# Patient Record
Sex: Female | Born: 1975 | Race: White | Hispanic: No | Marital: Married | Smoking: Former smoker
Health system: Southern US, Community
[De-identification: ages and names within clinical notes are randomized; demographics above are authoritative.]

## PROBLEM LIST (undated history)

## (undated) DIAGNOSIS — R51 Headache: Secondary | ICD-10-CM

## (undated) DIAGNOSIS — R519 Headache, unspecified: Secondary | ICD-10-CM

## (undated) HISTORY — PX: NO PAST SURGERIES: SHX2092

## (undated) HISTORY — DX: Headache: R51

## (undated) HISTORY — DX: Headache, unspecified: R51.9

---

## 2004-08-29 ENCOUNTER — Other Ambulatory Visit: Admission: RE | Admit: 2004-08-29 | Discharge: 2004-08-29 | Payer: Self-pay | Admitting: Obstetrics and Gynecology

## 2007-08-17 ENCOUNTER — Inpatient Hospital Stay (HOSPITAL_COMMUNITY): Admission: AD | Admit: 2007-08-17 | Discharge: 2007-08-17 | Payer: Self-pay | Admitting: Obstetrics and Gynecology

## 2007-09-12 ENCOUNTER — Inpatient Hospital Stay (HOSPITAL_COMMUNITY): Admission: AD | Admit: 2007-09-12 | Discharge: 2007-09-12 | Payer: Self-pay | Admitting: Obstetrics and Gynecology

## 2007-12-06 ENCOUNTER — Inpatient Hospital Stay (HOSPITAL_COMMUNITY): Admission: AD | Admit: 2007-12-06 | Discharge: 2007-12-08 | Payer: Self-pay | Admitting: Obstetrics and Gynecology

## 2007-12-16 ENCOUNTER — Ambulatory Visit: Admission: RE | Admit: 2007-12-16 | Discharge: 2007-12-16 | Payer: Self-pay | Admitting: Obstetrics and Gynecology

## 2009-02-12 ENCOUNTER — Encounter: Admission: RE | Admit: 2009-02-12 | Discharge: 2009-02-12 | Payer: Self-pay | Admitting: Obstetrics and Gynecology

## 2009-08-20 ENCOUNTER — Encounter: Admission: RE | Admit: 2009-08-20 | Discharge: 2009-08-20 | Payer: Self-pay | Admitting: Obstetrics and Gynecology

## 2009-10-25 ENCOUNTER — Inpatient Hospital Stay (HOSPITAL_COMMUNITY): Admission: AD | Admit: 2009-10-25 | Discharge: 2009-10-25 | Payer: Self-pay | Admitting: Obstetrics and Gynecology

## 2009-10-26 ENCOUNTER — Ambulatory Visit (HOSPITAL_COMMUNITY): Admission: AD | Admit: 2009-10-26 | Discharge: 2009-10-26 | Payer: Self-pay | Admitting: Obstetrics and Gynecology

## 2009-12-18 ENCOUNTER — Inpatient Hospital Stay (HOSPITAL_COMMUNITY): Admission: AD | Admit: 2009-12-18 | Discharge: 2009-12-19 | Payer: Self-pay | Admitting: Obstetrics and Gynecology

## 2009-12-21 ENCOUNTER — Ambulatory Visit: Admission: RE | Admit: 2009-12-21 | Discharge: 2009-12-21 | Payer: Self-pay | Admitting: Obstetrics and Gynecology

## 2010-02-27 ENCOUNTER — Encounter: Payer: Self-pay | Admitting: Obstetrics and Gynecology

## 2010-04-19 LAB — CBC
HCT: 29.4 % — ABNORMAL LOW (ref 36.0–46.0)
MCH: 33.6 pg (ref 26.0–34.0)
MCV: 95.8 fL (ref 78.0–100.0)
MCV: 97.1 fL (ref 78.0–100.0)
Platelets: 248 10*3/uL (ref 150–400)
Platelets: 295 10*3/uL (ref 150–400)
RBC: 3.03 MIL/uL — ABNORMAL LOW (ref 3.87–5.11)
RDW: 13.6 % (ref 11.5–15.5)
WBC: 11 10*3/uL — ABNORMAL HIGH (ref 4.0–10.5)

## 2010-11-08 LAB — CBC
HCT: 35.8 — ABNORMAL LOW
Hemoglobin: 10.8 — ABNORMAL LOW
MCHC: 34
Platelets: 210
Platelets: 291
RBC: 3.3 — ABNORMAL LOW
RBC: 3.74 — ABNORMAL LOW
RDW: 13.8
RDW: 14
WBC: 16.4 — ABNORMAL HIGH

## 2010-11-08 LAB — RPR: RPR Ser Ql: NONREACTIVE

## 2011-01-19 ENCOUNTER — Other Ambulatory Visit: Payer: Self-pay | Admitting: Family Medicine

## 2011-01-19 DIAGNOSIS — N649 Disorder of breast, unspecified: Secondary | ICD-10-CM

## 2011-02-03 ENCOUNTER — Ambulatory Visit
Admission: RE | Admit: 2011-02-03 | Discharge: 2011-02-03 | Disposition: A | Discharge status: Home / Self Care | Payer: 59 | Source: Ambulatory Visit | Attending: Family Medicine | Admitting: Family Medicine

## 2011-02-03 DIAGNOSIS — N649 Disorder of breast, unspecified: Secondary | ICD-10-CM

## 2014-01-21 ENCOUNTER — Encounter: Payer: Self-pay | Admitting: Neurology

## 2014-01-21 ENCOUNTER — Ambulatory Visit (INDEPENDENT_AMBULATORY_CARE_PROVIDER_SITE_OTHER): Payer: 59 | Admitting: Neurology

## 2014-01-21 VITALS — BP 132/68 | HR 100 | Temp 97.5°F | Resp 18 | Ht 65.0 in | Wt 133.7 lb

## 2014-01-21 DIAGNOSIS — M542 Cervicalgia: Secondary | ICD-10-CM

## 2014-01-21 DIAGNOSIS — G44211 Episodic tension-type headache, intractable: Secondary | ICD-10-CM

## 2014-01-21 MED ORDER — NORTRIPTYLINE HCL 50 MG PO CAPS: 50.0000 mg | ORAL_CAPSULE | Freq: Every day | ORAL | Status: DC

## 2014-01-21 MED ORDER — TIZANIDINE HCL 4 MG PO TABS: 4.0000 mg | ORAL_TABLET | Freq: Three times a day (TID) | ORAL | Status: DC | PRN

## 2014-01-21 NOTE — Progress Notes (Signed)
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  Records reviewed.  Onset:  In July 2014, developed daily headache for 4 months and then resolved.  From June to August 2015, had daily headache following chiropractic adjustment.  Started on nortriptyline and was doing well on 20mg  until she had another one month episode of daily headache.  Nortriptyline increased to 30mg  and she now only gets them for one week duration about once or twice a month. She had an XR of the cervical spine last year by her chiropractor, which was normal.  Location:  Starts with neck tightness and involves back and top of head. Quality:  Non-throbbing Intensity:  3-5/10 Aura:  no Prodrome:  no Associated symptoms:  No nausea, photophobia, phonophobia, visual disturbance, autonomic features Duration:  Now, 1 week.  Previously a couple of months. Frequency:  Once to twice a week. Triggers/exacerbating factors:  stress Relieving factors:  Heat Activity:  Able to function  Past abortive therapy:  tramadol, Fioricet, Extra strength Tylenol, ketorolac, Aleve Past preventative therapy:  none  Current abortive therapy:  cyclobenzaprine 10mg  for neck tension Current preventative therapy:  nortriptyline 30mg  Other medications:  Mirena  Caffeine:  1 cup coffee daily Alcohol:  occasionally Smoker:  no Diet:  good Exercise:  yes Depression/stress:  good Sleep hygiene:  good Family history of headache:  brother  PAST MEDICAL HISTORY: Past Medical History  Diagnosis Date  . Headache     PAST SURGICAL HISTORY: No past surgical history on file.  MEDICATIONS: No current  outpatient prescriptions on file prior to visit.   No current facility-administered medications on file prior to visit.    ALLERGIES: Not on File  FAMILY HISTORY: Family History  Problem Relation Age of Onset  . Cancer Mother   . Cirrhosis Father   . Cancer Paternal Grandmother     ovarian   . Heart failure Paternal Grandfather     SOCIAL HISTORY: History   Social History  . Marital Status: Married    Spouse Name: N/A    Number of Children: N/A  . Years of Education: N/A   Occupational History  . Not on file.   Social History Main Topics  . Smoking status: Former Research scientist (life sciences)  . Smokeless tobacco: Never Used  . Alcohol Use: 0.0 oz/week    0 Not specified per week     Comment: social   . Drug Use: No  . Sexual Activity:    Partners: Male   Other Topics Concern  . Not on file   Social History Narrative  . No narrative on file    REVIEW OF SYSTEMS: Constitutional: No fevers, chills, or sweats, no generalized fatigue, change in appetite Eyes: No visual changes, double vision, eye pain Ear, nose and throat: No hearing loss, ear pain, nasal congestion, sore throat Cardiovascular: No chest pain, palpitations Respiratory:  No shortness of breath at rest or with exertion, wheezes GastrointestinaI: No nausea, vomiting, diarrhea, abdominal pain, fecal incontinence Genitourinary:  No dysuria, urinary retention or frequency Musculoskeletal:  No neck pain, back pain Integumentary: No rash, pruritus, skin lesions Neurological: as above Psychiatric: No depression, insomnia, anxiety Endocrine: No palpitations, fatigue, diaphoresis, mood swings, change in appetite, change in  weight, increased thirst Hematologic/Lymphatic:  No anemia, purpura, petechiae. Allergic/Immunologic: no itchy/runny eyes, nasal congestion, recent allergic reactions, rashes  PHYSICAL EXAM: Filed Vitals:   01/21/14 0818  BP: 132/68  Pulse: 100  Temp: 97.5 F (36.4 C)  Resp: 18   General: No acute  distress Head:  Normocephalic/atraumatic Eyes:  fundi unremarkable, without vessel changes, exudates, hemorrhages or papilledema. Neck: supple, no paraspinal tenderness, full range of motion Back: No paraspinal tenderness Heart: regular rate and rhythm Lungs: Clear to auscultation bilaterally. Vascular: No carotid bruits. Neurological Exam: Mental status: alert and oriented to person, place, and time, recent and remote memory intact, fund of knowledge intact, attention and concentration intact, speech fluent and not dysarthric, language intact. Cranial nerves: CN I: not tested CN II: pupils equal, round and reactive to light, visual fields intact, fundi unremarkable, without vessel changes, exudates, hemorrhages or papilledema. CN III, IV, VI:  full range of motion, no nystagmus, no ptosis CN V: facial sensation intact CN VII: upper and lower face symmetric CN VIII: hearing intact CN IX, X: gag intact, uvula midline CN XI: sternocleidomastoid and trapezius muscles intact CN XII: tongue midline Bulk & Tone: normal, no fasciculations. Motor:  5/5 throughout Sensation:  Pinprick and vibration intact Deep Tendon Reflexes:  2+ throughout, toes downgoing Finger to nose testing:  No dysmetria Heel to shin:  No dysmetria Gait:  Normal station and stride.  Able to turn and walk in tandem. Romberg negative.  IMPRESSION: Episodic tension-type headaches, related to neck pain.  Normal, non-focal exam.  Will hold off on imaging of the brain at this point.  It really seems like tension and she is responding to medication at this point.  PLAN: 1.  Increase nortriptyline to 50mg  daily.  Call in 4 weeks with update 2.  Instead of cyclobenzaprine, will try tizanidine 4mg  to take at earliest onset of neck tension 3.  Follow up in 2 months.  Thank you for allowing me to take part in the care of this patient.  Metta Clines, DO  CC: Carlos Levering, PA-C

## 2014-01-21 NOTE — Patient Instructions (Signed)
1.  We will increase the nortriptyline.  I prescribed 50mg  capsule.  Take at bedtime.  Call in 4 weeks with update 2.  Instead of Flexeril, try tizanidine 4mg .  Take when you first feel the neck tension and hopefully will prevent onset of headache 3.  Follow up in 2 months.

## 2014-02-19 ENCOUNTER — Telehealth: Payer: Self-pay | Admitting: Neurology

## 2014-02-19 NOTE — Telephone Encounter (Signed)
Pt called and states that she has been on the medication for 4 weeks and it is working just wanted [place] to know if you need to talk to her you may call her at [phone]

## 2014-02-20 MED ORDER — NORTRIPTYLINE HCL 50 MG PO CAPS: 50.0000 mg | ORAL_CAPSULE | Freq: Every day | ORAL | Status: DC

## 2014-02-20 NOTE — Telephone Encounter (Signed)
Prescription sent to patient's pharmacy.

## 2014-02-20 NOTE — Telephone Encounter (Signed)
Pt called after the message form yesterday regarding her wanting the script for NORTRIPTYLINE 50mg  to be called in to her pharmacy since the samples worked out for her.  Pharmacy[facility]  C/b [phone]

## 2014-02-24 ENCOUNTER — Encounter: Payer: Self-pay | Admitting: Neurology

## 2014-03-06 ENCOUNTER — Other Ambulatory Visit: Payer: Self-pay | Admitting: Family Medicine

## 2014-03-06 DIAGNOSIS — N6459 Other signs and symptoms in breast: Secondary | ICD-10-CM

## 2014-03-17 ENCOUNTER — Ambulatory Visit
Admission: RE | Admit: 2014-03-17 | Discharge: 2014-03-17 | Disposition: A | Discharge status: Home / Self Care | Payer: 59 | Source: Ambulatory Visit | Attending: Family Medicine | Admitting: Family Medicine

## 2014-03-17 DIAGNOSIS — N6459 Other signs and symptoms in breast: Secondary | ICD-10-CM

## 2014-03-27 ENCOUNTER — Ambulatory Visit (INDEPENDENT_AMBULATORY_CARE_PROVIDER_SITE_OTHER): Payer: 59 | Admitting: Neurology

## 2014-03-27 ENCOUNTER — Encounter: Payer: Self-pay | Admitting: Neurology

## 2014-03-27 VITALS — BP 132/78 | HR 112 | Ht 63.0 in | Wt 136.0 lb

## 2014-03-27 DIAGNOSIS — R51 Headache: Secondary | ICD-10-CM

## 2014-03-27 DIAGNOSIS — G4486 Cervicogenic headache: Secondary | ICD-10-CM

## 2014-03-27 DIAGNOSIS — G44219 Episodic tension-type headache, not intractable: Secondary | ICD-10-CM

## 2014-03-27 DIAGNOSIS — M542 Cervicalgia: Secondary | ICD-10-CM

## 2014-03-27 NOTE — Progress Notes (Signed)
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  UPDATE: Current abortive therapy:  tizanidine 4mg  Current preventative therapy:  nortriptyline 50mg   Headaches have almost completely resolved.  However, the neck pain is still the problem.  Tizanidine helps if she takes it early enough.  Some days, she is pain-free.  On occasion, it can cause pain traveling up the back of the head.  HISTORY: Onset:  In July 2014, developed daily headache for 4 months and then resolved.  From June to August 2015, had daily headache following chiropractic adjustment.  Started on nortriptyline and was doing well on 20mg  until she had another one month episode of daily headache.  Nortriptyline increased to 30mg  and she now only gets them for one week duration about once or twice a month. She had an XR of the cervical spine last year by her chiropractor, which was normal.  Location:  Starts with neck tightness and involves back and top of head. Quality:  Non-throbbing Initial Intensity:  3-5/10 Aura:  no Prodrome:  no Associated symptoms:  No nausea, photophobia, phonophobia, visual disturbance, autonomic features Initial Duration:  Now, 1 week.  Previously a couple of months. Initial Frequency:  Once to twice a week. Triggers/exacerbating factors:  stress Relieving factors:  Heat Activity:  Able to function  Past abortive therapy:  tramadol, Fioricet, Extra strength Tylenol, ketorolac, Aleve, cyclobenzaprine Past preventative therapy:  none  PAST MEDICAL HISTORY: Past Medical History  Diagnosis Date  . Headache     MEDICATIONS: Current Outpatient Prescriptions on File Prior to Visit  Medication Sig Dispense Refill  . nortriptyline (PAMELOR) 50 MG capsule Take 1 capsule (50 mg  total) by mouth at bedtime. 30 capsule 3  . tiZANidine (ZANAFLEX) 4 MG tablet Take 1 tablet (4 mg total) by mouth every 8 (eight) hours as needed for muscle spasms. 30 tablet 0   No current facility-administered medications on file prior to visit.    ALLERGIES: No Known Allergies  FAMILY HISTORY: Family History  Problem Relation Age of Onset  . Cancer Mother   . Cirrhosis Father   . Cancer Paternal Grandmother     ovarian   . Heart failure Paternal Grandfather     SOCIAL HISTORY: History   Social History  . Marital Status: Married    Spouse Name: N/A  . Number of Children: N/A  . Years of Education: N/A   Occupational History  . Not on file.   Social History Main Topics  . Smoking status: Former Research scientist (life sciences)  . Smokeless tobacco: Never Used  . Alcohol Use: 0.0 oz/week    0 Standard drinks or equivalent per week     Comment: social   . Drug Use: No  . Sexual Activity:    Partners: Male   Other Topics Concern  . Not on file   Social History Narrative    REVIEW OF SYSTEMS: Constitutional: No fevers, chills, or sweats, no generalized fatigue, change in appetite Eyes: No visual changes, double vision, eye pain Ear, nose and throat: No hearing loss, ear pain, nasal congestion, sore throat Cardiovascular: No chest pain, palpitations Respiratory:  No shortness of breath at rest or with exertion, wheezes GastrointestinaI: No nausea, vomiting, diarrhea, abdominal pain, fecal incontinence Genitourinary:  No dysuria, urinary retention or frequency Musculoskeletal:  Neck Pain Integumentary: No rash, pruritus, skin lesions Neurological: as above Psychiatric: No depression, insomnia, anxiety Endocrine: No palpitations, fatigue, diaphoresis, mood swings, change in appetite, change in weight, increased thirst Hematologic/Lymphatic:  No anemia, purpura, petechiae. Allergic/Immunologic: no itchy/runny eyes, nasal congestion, recent allergic reactions, rashes  PHYSICAL  EXAM: Filed Vitals:   03/27/14 0853  BP: 132/78  Pulse: 112   General: No acute distress Head:  Normocephalic/atraumatic Eyes:  Fundoscopic exam unremarkable without vessel changes, exudates, hemorrhages or papilledema. Neck: supple, no paraspinal tenderness, full range of motion Heart:  Regular rate and rhythm Lungs:  Clear to auscultation bilaterally Back: No paraspinal tenderness Neurological Exam: alert and oriented to person, place, and time. Attention span and concentration intact, recent and remote memory intact, fund of knowledge intact.  Speech fluent and not dysarthric, language intact.  CN II-XII intact. Fundoscopic exam unremarkable without vessel changes, exudates, hemorrhages or papilledema.  Bulk and tone normal, muscle strength 5/5 throughout.  Deep tendon reflexes 2+ throughout, toes downgoing.  Finger to nose testing intact.  Gait normal  IMPRESSION: Cervicogenic/tension type headaches, improved Neck pain  PLAN: 1.  Will refer to Dr. Tamala [facility] of [facility] for evaluation and treatment of neck pain 2.  Continue nortriptyline 50mg  at bedtime 3.  Tizanidine 4mg  as needed 4.  Follow up in 3 months.  Metta Clines, DO  CC: Carlos Levering, PA-C

## 2014-03-27 NOTE — Patient Instructions (Signed)
1.  Continue nortriptyline 50mg  at bedtime 2.  Tizanidine 4mg  as needed for neck pain 3.  Will refer to [facility] for evaluation and treatment of neck pain. 4.  Follow up in 3 months.

## 2014-03-30 ENCOUNTER — Telehealth: Payer: Self-pay | Admitting: Family Medicine

## 2014-03-30 NOTE — Telephone Encounter (Signed)
Patient is a new patient.  She is requesting an appointment for the 26th.

## 2014-03-30 NOTE — Telephone Encounter (Signed)
Spoke to pt, she was wanting to be put on the waiting list for Friday if anything opens up.

## 2014-04-03 ENCOUNTER — Telehealth: Payer: Self-pay | Admitting: Neurology

## 2014-04-03 NOTE — Telephone Encounter (Signed)
Note faxed to Dr Eloise Levels at [phone] with confirmation received.

## 2014-04-10 ENCOUNTER — Ambulatory Visit (INDEPENDENT_AMBULATORY_CARE_PROVIDER_SITE_OTHER): Payer: 59 | Admitting: Family Medicine

## 2014-04-10 ENCOUNTER — Encounter: Payer: Self-pay | Admitting: Family Medicine

## 2014-04-10 VITALS — BP 142/84 | HR 102 | Ht 63.0 in | Wt 137.0 lb

## 2014-04-10 DIAGNOSIS — M9902 Segmental and somatic dysfunction of thoracic region: Secondary | ICD-10-CM

## 2014-04-10 DIAGNOSIS — M999 Biomechanical lesion, unspecified: Secondary | ICD-10-CM | POA: Insufficient documentation

## 2014-04-10 DIAGNOSIS — R51 Headache: Secondary | ICD-10-CM

## 2014-04-10 DIAGNOSIS — G4486 Cervicogenic headache: Secondary | ICD-10-CM | POA: Insufficient documentation

## 2014-04-10 DIAGNOSIS — M9901 Segmental and somatic dysfunction of cervical region: Secondary | ICD-10-CM

## 2014-04-10 DIAGNOSIS — M9908 Segmental and somatic dysfunction of rib cage: Secondary | ICD-10-CM

## 2014-04-10 NOTE — Assessment & Plan Note (Signed)
Discussed with patient at great length. I do believe that patient's stress level as well as musculoskeletal complaints is causing her headaches. We discussed postural changes at work as well as over-the-counter natural supplementations and can decrease inflammation. We discussed diet changes. Patient did respond very well to osteopathic manipulation today as well. Patient is going to try to make these daily changes and come back and see me again in 2-3 weeks for further evaluation and management.  Very optimistic patient will do significantly well with conservative therapy.

## 2014-04-10 NOTE — Patient Instructions (Signed)
Good to see you.   Exercises 3 times a week.  On wall heels, butt, shoulder and head touching for goal of 5 minutes daily Keep monitor at eye level.  2 tennis balls in tube sock and lay on it.  Watch sleeping positoin Vitamin D 2000 IU daily Turmeric 500mg  twice daily Fish oil 2 grams daily Ibuprofen 600mg  3 times a day for 3-5 day or aleve 2 pills 2 times a day for 3 days See me again in 2-3 weeks.

## 2014-04-10 NOTE — Progress Notes (Signed)
[name] [place], [place] [address] Phone: [phone] Subjective:    I'm seeing this patient by the request  of:  WILLARD,JENNIFER, PA-C   CC: Headaches and neck pain.   [id number] Caitlyn [name] is a 39 y.o. female coming in with complaint of headaches and neck pain. Patient has been followed by neurology previously. Patient has been taken some medications and does have some benefit from the muscle relaxer. Patient did have some chiropractic adjustments and unfortunately it seemed to make her headaches worse. Patient continued to have headaches even one time a week after nortriptyline was added. Patient has had x-rays of her neck which were unremarkable. These were reviewed by me today. States that it only seems to be more of a neck tightness that occurs first and then radiates towards the top of her head. Patient describes it as more of a dull ache than a true throbbing. Patient states the severity of pain can be 5 out of 10. Denies any photophobia, phonophobia, visual disturbance, or any numbness or weakness in any of the extremities. Patient also denies any fevers or chills or any weight loss. Patient has found that different triggers especially stressed seems to be associated with them. Patient states that the frequency is 1-2 times a week. Still able to do all daily activities and denies any nighttime awakening.     Past medical history, social, surgical and family history all reviewed in electronic medical record.   Review of Systems: No headache, visual changes, nausea, vomiting, diarrhea, constipation, dizziness, abdominal pain, skin rash, fevers, chills, night sweats, weight loss, swollen lymph nodes, body aches, joint swelling, muscle aches, chest pain, shortness of breath, mood changes.   Objective Blood pressure 142/84, pulse 102, height 5\' 3"  (1.6 m), weight 137 lb (62.143 kg), SpO2 98 %.  General: No apparent distress alert and  oriented x3 mood and affect normal, dressed appropriately.  HEENT: Pupils equal, extraocular movements intact  Respiratory: Patient's speak in full sentences and does not appear short of breath  Cardiovascular: No lower extremity edema, non tender, no erythema  Skin: Warm dry intact with no signs of infection or rash on extremities or on axial skeleton.  Abdomen: Soft nontender  Neuro: Cranial nerves II through XII are intact, neurovascularly intact in all extremities with 2+ DTRs and 2+ pulses.  Lymph: No lymphadenopathy of posterior or anterior cervical chain or axillae bilaterally.  Gait normal with good balance and coordination.  MSK:  Non tender with full range of motion and good stability and symmetric strength and tone of shoulders, elbows, wrist, hip, knee and ankles bilaterally.  Neck: Inspection unremarkable. No palpable stepoffs. Negative Spurling's maneuver. Mild limitation with right-sided rotation and left-sided side bending. Grip strength and sensation normal in bilateral hands Strength good C4 to T1 distribution No sensory change to C4 to T1 Negative Hoffman sign bilaterally Reflexes normal  OMT Physical Exam  Standing structural       Occiput right on left  Shoulder right higher  Inferior angle of scapula right higher    Standing flexion left  Seated Flexion left  Cervical  C2 flexed rotated and side bent right C4 flexed rotated and side bent left C6 flexed rotated and side bent right  Thoracic T2 extended rotated and side bent right with inhaled second rib  Lumbar L2 flexed rotated and side bent left  Sacrum Left on left  Illium Left anterior ilium      Impression  and Recommendations:     This case required medical decision making of moderate complexity.

## 2014-04-10 NOTE — Progress Notes (Signed)
Pre visit review using our clinic review tool, if applicable. No additional management support is needed unless otherwise documented below in the visit note. 

## 2014-04-10 NOTE — Assessment & Plan Note (Signed)
Decision today to treat with OMT was based on Physical Exam  After verbal consent patient was treated with HVLA, ME techniques in cervical, thoracic, lumbar and sacral as well as rib areas  Patient tolerated the procedure well with improvement in symptoms  Patient given exercises, stretches and lifestyle modifications  See medications in patient instructions if given  Patient will follow up in 2-3 weeks

## 2014-04-24 ENCOUNTER — Ambulatory Visit (INDEPENDENT_AMBULATORY_CARE_PROVIDER_SITE_OTHER): Payer: 59 | Admitting: Family Medicine

## 2014-04-24 ENCOUNTER — Encounter: Payer: Self-pay | Admitting: Family Medicine

## 2014-04-24 VITALS — BP 130/84 | HR 122 | Ht 63.0 in | Wt 136.0 lb

## 2014-04-24 DIAGNOSIS — M9908 Segmental and somatic dysfunction of rib cage: Secondary | ICD-10-CM

## 2014-04-24 DIAGNOSIS — M9902 Segmental and somatic dysfunction of thoracic region: Secondary | ICD-10-CM

## 2014-04-24 DIAGNOSIS — M9901 Segmental and somatic dysfunction of cervical region: Secondary | ICD-10-CM

## 2014-04-24 DIAGNOSIS — G4486 Cervicogenic headache: Secondary | ICD-10-CM

## 2014-04-24 DIAGNOSIS — R51 Headache: Secondary | ICD-10-CM

## 2014-04-24 DIAGNOSIS — M999 Biomechanical lesion, unspecified: Secondary | ICD-10-CM

## 2014-04-24 NOTE — Progress Notes (Signed)
Pre visit review using our clinic review tool, if applicable. No additional management support is needed unless otherwise documented below in the visit note. 

## 2014-04-24 NOTE — Patient Instructions (Signed)
You are doing great Keep it up and the posture is doing great! Continue the vitamins Continue to work on sleeping position My job is easy See me again in 3-4 weeks.

## 2014-04-24 NOTE — Assessment & Plan Note (Signed)
Decision today to treat with OMT was based on Physical Exam  After verbal consent patient was treated with HVLA, ME techniques in cervical, thoracic, lumbar and sacral as well as rib areas  Patient tolerated the procedure well with improvement in symptoms  Patient given exercises, stretches and lifestyle modifications  See medications in patient instructions if given  Patient will follow up in 3-4 weeks

## 2014-04-24 NOTE — Assessment & Plan Note (Signed)
She is doing significantly better with decreased amount headaches. Patient will keep with the postural exercises and keep working on retraction of the shoulder blades. We discussed icing regimen and continuing the natural supplementations. Patient will be spaced out and we will go to 4 week intervals.

## 2014-04-24 NOTE — Progress Notes (Signed)
  [name] [place], [place] [address] Phone: [phone] Subjective:     CC: Headaches and neck pain follow-up  [id number] Caitlyn [name] is a 39 y.o. female coming in with complaint of headaches and neck pain. Patient has been followed by neurology previously. Patient did have osteopathic manipulation and we did do different diet changes. Patient states that she is approximately 80% better. Really having minimal headaches at this time. Continues the medications on an as-needed basis. Patient is taking vitamins without any side effects. Patient is very happy with the results.      Past medical history, social, surgical and family history all reviewed in electronic medical record.   Review of Systems: No headache, visual changes, nausea, vomiting, diarrhea, constipation, dizziness, abdominal pain, skin rash, fevers, chills, night sweats, weight loss, swollen lymph nodes, body aches, joint swelling, muscle aches, chest pain, shortness of breath, mood changes.   Objective Blood pressure 130/84, pulse 122, height 5\' 3"  (1.6 m), weight 136 lb (61.689 kg), SpO2 99 %.  General: No apparent distress alert and oriented x3 mood and affect normal, dressed appropriately.  HEENT: Pupils equal, extraocular movements intact  Respiratory: Patient's speak in full sentences and does not appear short of breath  Cardiovascular: No lower extremity edema, non tender, no erythema  Skin: Warm dry intact with no signs of infection or rash on extremities or on axial skeleton.  Abdomen: Soft nontender  Neuro: Cranial nerves II through XII are intact, neurovascularly intact in all extremities with 2+ DTRs and 2+ pulses.  Lymph: No lymphadenopathy of posterior or anterior cervical chain or axillae bilaterally.  Gait normal with good balance and coordination.  MSK:  Non tender with full range of motion and good stability and symmetric strength and tone of shoulders,  elbows, wrist, hip, knee and ankles bilaterally.  Neck: Inspection unremarkable. No palpable stepoffs. Negative Spurling's maneuver. Mild limitation with right-sided rotation and left-sided side bending. Grip strength and sensation normal in bilateral hands Strength good C4 to T1 distribution No sensory change to C4 to T1 Negative Hoffman sign bilaterally Reflexes normal  OMT Physical Exam  Standing structural       Occiput right on left  Shoulder right higher    Cervical  C2 flexed rotated and side bent right C4 flexed rotated and side bent left C6 flexed rotated and side bent right  Thoracic T2 extended rotated and side bent right with inhaled second rib  Lumbar L2 flexed rotated and side bent left  Sacrum Left on left  Illium Left anterior ilium      Impression and Recommendations:     This case required medical decision making of moderate complexity.

## 2014-05-13 ENCOUNTER — Ambulatory Visit: Payer: 59 | Admitting: Family Medicine

## 2014-05-19 ENCOUNTER — Ambulatory Visit (INDEPENDENT_AMBULATORY_CARE_PROVIDER_SITE_OTHER): Payer: 59 | Admitting: Family Medicine

## 2014-05-19 ENCOUNTER — Encounter: Payer: Self-pay | Admitting: Family Medicine

## 2014-05-19 VITALS — BP 122/82 | HR 124 | Ht 63.0 in | Wt 136.0 lb

## 2014-05-19 DIAGNOSIS — M9902 Segmental and somatic dysfunction of thoracic region: Secondary | ICD-10-CM | POA: Diagnosis not present

## 2014-05-19 DIAGNOSIS — M999 Biomechanical lesion, unspecified: Secondary | ICD-10-CM

## 2014-05-19 DIAGNOSIS — R51 Headache: Secondary | ICD-10-CM

## 2014-05-19 DIAGNOSIS — M9908 Segmental and somatic dysfunction of rib cage: Secondary | ICD-10-CM | POA: Diagnosis not present

## 2014-05-19 DIAGNOSIS — M9901 Segmental and somatic dysfunction of cervical region: Secondary | ICD-10-CM | POA: Diagnosis not present

## 2014-05-19 DIAGNOSIS — G4486 Cervicogenic headache: Secondary | ICD-10-CM

## 2014-05-19 NOTE — Patient Instructions (Signed)
Good to see you Caitlyn [name] is your friend Keep using the tennis balls Use the muscle relaxer Consider decreasing the caffeine and starting DHEA 50 mg daily See me again in 4 weeks

## 2014-05-19 NOTE — Progress Notes (Signed)
  [name] [place] [place], [place] [address] Phone: [phone] Subjective:     CC: Headaches and neck pain follow-up  [id number] Caitlyn [name] is a 39 y.o. female coming in with complaint of headaches and neck pain. Patient has been followed by neurology previously. Patient did have osteopathic manipulation and we did do different diet changes. Patient was doing significantly better but is continuing to have some pain. Patient has needed a muscle relaxer fairly regularly. Patient notices most of time the headache seems to be more at night after for 5 PM after patient is done with work. Patient denies any radiation into her hands. Patient feels still that she is much better than her previous baseline but not as much improvement as she did previously.      Past medical history, social, surgical and family history all reviewed in electronic medical record.   Review of Systems: No headache, visual changes, nausea, vomiting, diarrhea, constipation, dizziness, abdominal pain, skin rash, fevers, chills, night sweats, weight loss, swollen lymph nodes, body aches, joint swelling, muscle aches, chest pain, shortness of breath, mood changes.   Objective Blood pressure 122/82, pulse 124, height 5\' 3"  (1.6 m), weight 136 lb (61.689 kg), SpO2 99 %.  General: No apparent distress alert and oriented x3 mood and affect normal, dressed appropriately.  HEENT: Pupils equal, extraocular movements intact  Respiratory: Patient's speak in full sentences and does not appear short of breath  Cardiovascular: No lower extremity edema, non tender, no erythema  Skin: Warm dry intact with no signs of infection or rash on extremities or on axial skeleton.  Abdomen: Soft nontender  Neuro: Cranial nerves II through XII are intact, neurovascularly intact in all extremities with 2+ DTRs and 2+ pulses.  Lymph: No lymphadenopathy of posterior or anterior cervical chain or axillae  bilaterally.  Gait normal with good balance and coordination.  MSK:  Non tender with full range of motion and good stability and symmetric strength and tone of shoulders, elbows, wrist, hip, knee and ankles bilaterally.  Neck: Inspection unremarkable. No palpable stepoffs. Negative Spurling's maneuver. Patient does have some mild increase hypertrophia of the sternocleidomastoid compared to the contralateral side. On right side. Grip strength and sensation normal in bilateral hands Strength good C4 to T1 distribution No sensory change to C4 to T1 Negative Hoffman sign bilaterally Reflexes normal  OMT Physical Exam     Cervical  C2 flexed rotated and side bent right C4 flexed rotated and side bent left C6 flexed rotated and side bent right  Thoracic T2 extended rotated and side bent right with inhaled second rib  Lumbar L2 flexed rotated and side bent left  Sacrum Left on left  Illium Left anterior ilium      Impression and Recommendations:     This case required medical decision making of moderate complexity.

## 2014-05-19 NOTE — Assessment & Plan Note (Signed)
Decision today to treat with OMT was based on Physical Exam  After verbal consent patient was treated with HVLA, ME techniques in cervical, thoracic, lumbar and sacral as well as rib areas  Patient tolerated the procedure well with improvement in symptoms  Patient given exercises, stretches and lifestyle modifications  See medications in patient instructions if given  Patient will follow up in 3-4 weeks

## 2014-05-19 NOTE — Assessment & Plan Note (Signed)
Discussed with patient about other over-the-counter natural supplementations a could be beneficial. We discussed continuing the other ones at this time. 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 Patient try to make these changes and come back and see me again in 3-4 weeks. Continues to respond very well to osteopathic manipulation.

## 2014-05-19 NOTE — Progress Notes (Signed)
Pre visit review using our clinic review tool, if applicable. No additional management support is needed unless otherwise documented below in the visit note. 

## 2014-06-11 ENCOUNTER — Encounter: Payer: Self-pay | Admitting: Family Medicine

## 2014-06-11 ENCOUNTER — Ambulatory Visit: Payer: 59 | Admitting: Family Medicine

## 2014-06-11 ENCOUNTER — Ambulatory Visit (INDEPENDENT_AMBULATORY_CARE_PROVIDER_SITE_OTHER): Payer: 59 | Admitting: Family Medicine

## 2014-06-11 VITALS — BP 124/68 | HR 133 | Ht 63.0 in | Wt 136.0 lb

## 2014-06-11 DIAGNOSIS — R51 Headache: Secondary | ICD-10-CM

## 2014-06-11 DIAGNOSIS — M9908 Segmental and somatic dysfunction of rib cage: Secondary | ICD-10-CM

## 2014-06-11 DIAGNOSIS — M9901 Segmental and somatic dysfunction of cervical region: Secondary | ICD-10-CM | POA: Diagnosis not present

## 2014-06-11 DIAGNOSIS — G4486 Cervicogenic headache: Secondary | ICD-10-CM

## 2014-06-11 DIAGNOSIS — M999 Biomechanical lesion, unspecified: Secondary | ICD-10-CM

## 2014-06-11 DIAGNOSIS — M9902 Segmental and somatic dysfunction of thoracic region: Secondary | ICD-10-CM | POA: Diagnosis not present

## 2014-06-11 NOTE — Assessment & Plan Note (Signed)
Patient continues to have some difficulty. We discussed focusing on more of the posterior deltoids and rhomboid strengthening and set a trying to focus on stretching the neck. We also discussed continuing the over-the-counter medications and potentially more of the self manipulation techniques. We will refer patient to physical therapy for dry needling. We discussed continuing the other medications if necessary. Patient and will come back and see me again in 3 weeks and we'll be following up with neurology in the near future.

## 2014-06-11 NOTE — Patient Instructions (Signed)
Good to see you Continue the exercises.  Consider working a little more on posterior deltoids and rhomboids for strengthening to take pressure of the neck.  Remember to lay on the the tennis balls at night at the base of the skull for a release.  You need to give yourself more credit Try the dry needling Not ready to space you out yet would say another 3 weeks.

## 2014-06-11 NOTE — Progress Notes (Signed)
Pre visit review using our clinic review tool, if applicable. No additional management support is needed unless otherwise documented below in the visit note. 

## 2014-06-11 NOTE — Assessment & Plan Note (Signed)
Decision today to treat with OMT was based on Physical Exam  After verbal consent patient was treated with HVLA, ME techniques in cervical, thoracic, lumbar and sacral as well as rib areas  Patient tolerated the procedure well with improvement in symptoms  Patient given exercises, stretches and lifestyle modifications  See medications in patient instructions if given  Patient will follow up in 3 weeks

## 2014-06-11 NOTE — Progress Notes (Signed)
  [name] [place], [place] [address] Phone: [phone] Subjective:     CC: Headaches and neck pain follow-up  [id number] MELEANA COMMERFORD is a 39 y.o. female coming in with complaint of headaches and neck pain. Patient has been followed by neurology previously. Patient did have osteopathic manipulation and we did do different diet changes. Patient continues to have intermittent headaches in this still seems to be worse at the end of the long day. Patient states she continues to make very small strides. Patient states that with keeping a diary she noticed 30% of the day she was continuing to have headaches and did not have any specific triggers. Patient try to decrease her caffeine intake as well as 38 8 a with no significant improvement at this time. Patient is wondering what other modalities can be used as adjuvant treatments.      Past medical history, social, surgical and family history all reviewed in electronic medical record.   Review of Systems: No headache, visual changes, nausea, vomiting, diarrhea, constipation, dizziness, abdominal pain, skin rash, fevers, chills, night sweats, weight loss, swollen lymph nodes, body aches, joint swelling, muscle aches, chest pain, shortness of breath, mood changes.   Objective Blood pressure 124/68, pulse 133, height 5\' 3"  (1.6 m), weight 136 lb (61.689 kg), SpO2 99 %.  General: No apparent distress alert and oriented x3 mood and affect normal, dressed appropriately.  HEENT: Pupils equal, extraocular movements intact  Respiratory: Patient's speak in full sentences and does not appear short of breath  Cardiovascular: No lower extremity edema, non tender, no erythema  Skin: Warm dry intact with no signs of infection or rash on extremities or on axial skeleton.  Abdomen: Soft nontender  Neuro: Cranial nerves II through XII are intact, neurovascularly intact in all extremities with 2+ DTRs and 2+  pulses.  Lymph: No lymphadenopathy of posterior or anterior cervical chain or axillae bilaterally.  Gait normal with good balance and coordination.  MSK:  Non tender with full range of motion and good stability and symmetric strength and tone of shoulders, elbows, wrist, hip, knee and ankles bilaterally.  Neck: Inspection unremarkable. No palpable stepoffs. Negative Spurling's maneuver. Patient does have some mild increase hypertrophia of the sternocleidomastoid compared to the contralateral side. On right side. No change from previous exam Grip strength and sensation normal in bilateral hands Strength good C4 to T1 distribution No sensory change to C4 to T1 Negative Hoffman sign bilaterally Reflexes normal  OMT Physical Exam     Cervical  C2 flexed rotated and side bent right still main area of concern C4 flexed rotated and side bent left C6 flexed rotated and side bent right  Thoracic T2 extended rotated and side bent right with inhaled second rib  Lumbar L2 flexed rotated and side bent left  Sacrum Left on left  Illium Left anterior ilium      Impression and Recommendations:     This case required medical decision making of moderate complexity.

## 2014-06-19 ENCOUNTER — Other Ambulatory Visit: Payer: Self-pay | Admitting: Obstetrics and Gynecology

## 2014-06-22 LAB — CYTOLOGY - PAP

## 2014-06-26 ENCOUNTER — Encounter: Payer: Self-pay | Admitting: Neurology

## 2014-06-26 ENCOUNTER — Ambulatory Visit: Payer: 59 | Attending: Family Medicine | Admitting: Physical Therapy

## 2014-06-26 ENCOUNTER — Ambulatory Visit (INDEPENDENT_AMBULATORY_CARE_PROVIDER_SITE_OTHER): Payer: 59 | Admitting: Neurology

## 2014-06-26 VITALS — BP 130/80 | HR 68 | Resp 16 | Ht 63.0 in | Wt 135.6 lb

## 2014-06-26 DIAGNOSIS — R51 Headache: Secondary | ICD-10-CM | POA: Diagnosis not present

## 2014-06-26 DIAGNOSIS — M999 Biomechanical lesion, unspecified: Secondary | ICD-10-CM

## 2014-06-26 DIAGNOSIS — M9901 Segmental and somatic dysfunction of cervical region: Secondary | ICD-10-CM | POA: Diagnosis present

## 2014-06-26 DIAGNOSIS — G4486 Cervicogenic headache: Secondary | ICD-10-CM

## 2014-06-26 MED ORDER — NORTRIPTYLINE HCL 25 MG PO CAPS: 25.0000 mg | ORAL_CAPSULE | Freq: Every day | ORAL | Status: DC

## 2014-06-26 NOTE — Patient Instructions (Signed)

## 2014-06-26 NOTE — Therapy (Signed)
[place], [place], [address] Phone: [phone]   Fax:  [phone]  Physical Therapy Evaluation  Patient Details  Name: Caitlyn [name] MRN: [id number] Date of Birth: 1975/10/27 Referring Provider:  [name], DO  Encounter Date: 06/26/2014      PT End of Session - 06/26/14 1233    Visit Number 1   Number of Visits 8   Date for PT Re-Evaluation 08/21/14   PT Start Time 4259   PT Stop Time 0940   PT Time Calculation (min) 53 min   Activity Tolerance Patient tolerated treatment well      Past Medical History  Diagnosis Date  . Headache     No past surgical history on file.  There were no vitals filed for this visit.  Visit Diagnosis:  Nonallopathic lesion of cervical region - Plan: PT plan of care cert/re-cert  Cervicogenic headache - Plan: PT plan of care cert/re-cert      Subjective Assessment - 06/26/14 0852    Subjective The patient reports a > 1 year history of posterior neck pain and headaches which began for no apparent reason.  The headaches usually begin at the end of the day and may last until the next morning.     Limitations House hold activities   How long can you sit comfortably? better since working on her posture lately   Diagnostic tests x-ray  1 year ago   Patient Stated Goals try dry needling to see if remaining suboccipital pain will completely resolve   Currently in Pain? Yes   Pain Score 0-No pain   Pain Location Neck   Pain Orientation Upper   Pain Descriptors / Indicators Sore   Pain Type Chronic pain   Pain Radiating Towards no radiation   Aggravating Factors  end of the work day   Pain Relieving Factors last joint manipulation; posture correction            [facility] PT Assessment - 06/26/14 1227    Assessment   Medical Diagnosis cervicogenic HA   Onset Date --  1 year   Next MD Visit as needed   Prior Therapy self   Precautions   Precautions None    Restrictions   Weight Bearing Restrictions No   Balance Screen   Has the patient fallen in the past 6 months No   Has the patient had a decrease in activity level because of a fear of falling?  No   Is the patient reluctant to leave their home because of a fear of falling?  No   Home Environment   Living Enviornment Private residence   Living Arrangements Spouse/significant other;Children   Prior Function   Level of [place] with basic ADLs   Vocation Full time employment   Observation/Other Assessments   Observations right SCM more prominent than left   Focus on Therapeutic Outcomes (FOTO)  29% limit   Posture/Postural Control   Posture Comments decreased cervical lordosis   AROM   AROM Assessment Site Cervical  combined sidebend with rotation with asymmetries R/L   Cervical Flexion 58   Cervical Extension 53   Cervical - Right Side Bend 48   Cervical - Left Side Bend 48   Cervical - Right Rotation 53   Cervical - Left Rotation 55   Palpation   Palpation right suboccipital tenderness  [facility] PT Treatment/Exercise - 06/26/14 1227    Modalities   Modalities Moist Heat   Moist Heat Therapy   Number Minutes Moist Heat 10 Minutes   Moist Heat Location Cervical   Manual Therapy   Manual Therapy Myofascial release   Myofascial Release bilateral suboccipitals upper traps levator          Trigger Point Dry Needling - 06/26/14 1232    Consent Given? Yes   Education Handout Provided Yes  patient aware of risks, signs/symptoms of pneumothorax   Muscles Treated Upper Body Upper trapezius;Suboccipitals muscle group;Levator scapulae   Upper Trapezius Response Twitch reponse elicited;Palpable increased muscle length   SubOccipitals Response Palpable increased muscle length   Levator Scapulae Response Twitch response elicited;Palpable increased muscle length     Performed bilaterally.         PT Education - 06/26/14  1233    Education provided Yes   Education Details self care following dry needling   Person(s) Educated Patient   Methods Explanation;Demonstration;Handout   Comprehension Verbalized understanding             PT Long Term Goals - 06/26/14 1241    PT LONG TERM GOAL #1   Title Patient will be independent in safe, self progression of HEP for self management    Time 8   Period Weeks   Status New   PT LONG TERM GOAL #2   Title Patient will report a 50% improvement in headache occurrence and/or intensity   Time 8   Period Weeks   Status New   PT LONG TERM GOAL #3   Title Cervical AROM improved to WNLs with decreased asymmetries noted.   Time 8   Period Weeks   Status New   PT LONG TERM GOAL #4   Title FOTO functional outcome score improved to 25% indicating improved function with less pain   Time 8   Period Weeks   Status New               Plan - 06/26/14 1234    Clinical Impression Statement Patient complains of > 1 year history of headache primarily posterior in location.  Patient reports has had some frontal and temporal region pain but lately it has been in the right > left suboccipital region.    She notes good improvement with the last cervical/thoracic manipulations performed by the doctor and has had a decrease in frequency of her daily headaches.  It is felt to be a cervical muscular/myofascial issue and she is referred to PT for dry needling.  Right sternocleidomastoid more prominent than left.  Her cervical AROM slightly limited in all planes with some minor abnormal movement patterns and asymmetries noted in extension and with combined  side bending and rotation.  Tenderness in right suboccipitals.  Patient has been working on scapular and thoracic strengthening but is reluctant to perform neck specific exercises for fear of exacerbating her problem.  She seems to be a good candidate for dry needling and  was performed today.     Pt will benefit from skilled  therapeutic intervention in order to improve on the following deficits Pain;Increased muscle spasms;Decreased range of motion   Rehab Potential Good   PT Frequency 1x / week   PT Duration 8 weeks   PT Treatment/Interventions Dry needling;Manual techniques;Electrical Stimulation;Moist Heat;Therapeutic exercise   PT Next Visit Plan assess response to dry needling and continue as needed; manual techniques; HEP progression; modalities PRN  Problem List Patient Active Problem List   Diagnosis Date Noted  . Cervicogenic headache 04/10/2014  . Nonallopathic lesion of cervical region 04/10/2014  . Nonallopathic lesion of thoracic region 04/10/2014  . Nonallopathic lesion-rib cage 04/10/2014    Alvera Singh 06/26/2014, 12:47 PM  [facility] [address] [place], [place], [address] Phone: [phone]   Fax:  [phone]   Ruben Im, PT 06/26/2014 12:48 PM Phone: [phone] Fax: [phone]

## 2014-06-26 NOTE — Patient Instructions (Signed)
1.  Take nortriptyline 25mg  at bedtime for 7 days and then stop 2.  Follow up as needed.

## 2014-06-26 NOTE — Progress Notes (Signed)
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  Records reviewed.  UPDATE: Current abortive therapy:  tizanidine 4mg  Current preventative therapy:  nortriptyline 50mg   She was referred to Dr. Tamala [facility].  She has been treated with osteopathic manipulative medicine.  She hasn't had any headaches since last adjustment 2 weeks ago.  She is also taking supplements, such as Fish Oil, vitamin D.  She is interested in discontinuing nortriptyline and wonders what my thoughts are about that.  HISTORY: Onset:  In July 2014, developed daily headache for 4 months and then resolved.  From June to August 2015, had daily headache following chiropractic adjustment.  Started on nortriptyline and was doing well on 20mg  until she had another one month episode of daily headache.  Nortriptyline increased to 30mg  and she now only gets them for one week duration about once or twice a month. She had an XR of the cervical spine last year by her chiropractor, which was normal.   Location:  Starts with neck tightness and involves back and top of head. Quality:  Non-throbbing Initial Intensity:  3-5/10 Aura:  no Prodrome:  no Associated symptoms:  No nausea, photophobia, phonophobia, visual disturbance, autonomic features Initial Duration:  Now, 1 week.  Previously a couple of months. Initial Frequency:  Once to twice a week. Triggers/exacerbating factors:  stress Relieving factors:  Heat Activity:  Able to function  Past abortive therapy:  tramadol, Fioricet, Extra strength Tylenol, ketorolac, Aleve, cyclobenzaprine Past preventative therapy:  none  PAST MEDICAL HISTORY: Past Medical History  Diagnosis Date  . Headache     MEDICATIONS: Current Outpatient Prescriptions on  File Prior to Visit  Medication Sig Dispense Refill  . tiZANidine (ZANAFLEX) 4 MG tablet Take 1 tablet (4 mg total) by mouth every 8 (eight) hours as needed for muscle spasms. 30 tablet 0   No current facility-administered medications on file prior to visit.    ALLERGIES: No Known Allergies  FAMILY HISTORY: Family History  Problem Relation Age of Onset  . Cancer Mother   . Cirrhosis Father   . Cancer Paternal Grandmother     ovarian   . Heart failure Paternal Grandfather     SOCIAL HISTORY: History   Social History  . Marital Status: Married    Spouse Name: N/A  . Number of Children: N/A  . Years of Education: N/A   Occupational History  . Not on file.   Social History Main Topics  . Smoking status: Former Research scientist (life sciences)  . Smokeless tobacco: Never Used  . Alcohol Use: 0.0 oz/week    0 Standard drinks or equivalent per week     Comment: social   . Drug Use: No  . Sexual Activity:    Partners: Male   Other Topics Concern  . Not on file   Social History Narrative    REVIEW OF SYSTEMS: Constitutional: No fevers, chills, or sweats, no generalized fatigue, change in appetite Eyes: No visual changes, double vision, eye pain Ear, nose and throat: No hearing loss, ear pain, nasal congestion, sore throat Cardiovascular: No chest pain, palpitations Respiratory:  No shortness of breath at rest or with exertion, wheezes GastrointestinaI: No nausea, vomiting, diarrhea, abdominal pain, fecal incontinence Genitourinary:  No dysuria, urinary retention or frequency Musculoskeletal:  No neck  pain, back pain Integumentary: No rash, pruritus, skin lesions Neurological: as above Psychiatric: No depression, insomnia, anxiety Endocrine: No palpitations, fatigue, diaphoresis, mood swings, change in appetite, change in weight, increased thirst Hematologic/Lymphatic:  No anemia, purpura, petechiae. Allergic/Immunologic: no itchy/runny eyes, nasal congestion, recent allergic reactions,  rashes  PHYSICAL EXAM: Filed Vitals:   06/26/14 0753  BP: 130/80  Pulse: 68  Resp: 16   General: No acute distress Head:  Normocephalic/atraumatic Eyes:  Fundoscopic exam unremarkable without vessel changes, exudates, hemorrhages or papilledema. Neck: supple, no paraspinal tenderness, full range of motion Heart:  Regular rate and rhythm Lungs:  Clear to auscultation bilaterally Back: No paraspinal tenderness Neurological Exam: alert and oriented to person, place, and time. Attention span and concentration intact, recent and remote memory intact, fund of knowledge intact.  Speech fluent and not dysarthric, language intact.  CN II-XII intact. Fundoscopic exam unremarkable without vessel changes, exudates, hemorrhages or papilledema.  Bulk and tone normal, muscle strength 5/5 throughout.  Sensation to light touch, temperature and vibration intact.  Deep tendon reflexes 2+ throughout, toes downgoing.  Finger to nose and heel to shin testing intact.  Gait normal, Romberg negative.  IMPRESSION: Cervicogenic headache  PLAN: 1.  Since the root of the problem is being treated, and ideally we would not want her to be on a medication if necessary, it would be perfectly reasonable to stop the nortriptyline.  We will decrease dose to 25mg  at bedtime for 7 days and then stop. 2.  If headaches recur or if she has any problems, she may call.  Otherwise, follow up as needed.  15 minutes spent with patient face to face, over 50% spent discussing management.  Metta Clines, DO  CC:  Carlos Levering

## 2014-07-07 ENCOUNTER — Encounter: Payer: Self-pay | Admitting: Family Medicine

## 2014-07-07 ENCOUNTER — Ambulatory Visit (INDEPENDENT_AMBULATORY_CARE_PROVIDER_SITE_OTHER): Payer: 59 | Admitting: Family Medicine

## 2014-07-07 VITALS — BP 132/70 | HR 120 | Ht 63.0 in | Wt 135.0 lb

## 2014-07-07 DIAGNOSIS — M9901 Segmental and somatic dysfunction of cervical region: Secondary | ICD-10-CM | POA: Diagnosis not present

## 2014-07-07 DIAGNOSIS — M9908 Segmental and somatic dysfunction of rib cage: Secondary | ICD-10-CM | POA: Diagnosis not present

## 2014-07-07 DIAGNOSIS — R51 Headache: Secondary | ICD-10-CM

## 2014-07-07 DIAGNOSIS — G4486 Cervicogenic headache: Secondary | ICD-10-CM

## 2014-07-07 DIAGNOSIS — M9902 Segmental and somatic dysfunction of thoracic region: Secondary | ICD-10-CM

## 2014-07-07 DIAGNOSIS — M999 Biomechanical lesion, unspecified: Secondary | ICD-10-CM

## 2014-07-07 NOTE — Assessment & Plan Note (Signed)
Decision today to treat with OMT was based on Physical Exam  After verbal consent patient was treated with HVLA, ME techniques in cervical, thoracic, lumbar and sacral areas  Patient tolerated the procedure well with improvement in symptoms  Patient given exercises, stretches and lifestyle modifications  See medications in patient instructions if given  Patient will follow up in 4-6 weeks

## 2014-07-07 NOTE — Patient Instructions (Addendum)
Making my job too easy DHEA every other day for 2 weeks.  If pain returns go back to daily if not then discontinue.  Continue the vitamin D for sure and the rest of the cocktail.  COnitnue the exercises Lets space you out a bit.  Lets try 5 weeks.

## 2014-07-07 NOTE — Progress Notes (Signed)
  [name] [address] [place], [place] [address] Phone: [phone] Subjective:     CC: Headaches and neck pain follow-up  Caitlyn [name] is a 39 y.o. female coming in with complaint of headaches and neck pain. Patient has been followed by neurology previously. Patient has been responding very well to osteopathic manipulation previously. Patient states that the headache seemed to be a little less than usual. Patient states that she is actually only had one headache since last exam. Patient is very happy with the results. Has been doing dry needling and is no longer taking any medications on a regular basis.      Past medical history, social, surgical and family history all reviewed in electronic medical record.   Review of Systems: No headache, visual changes, nausea, vomiting, diarrhea, constipation, dizziness, abdominal pain, skin rash, fevers, chills, night sweats, weight loss, swollen lymph nodes, body aches, joint swelling, muscle aches, chest pain, shortness of breath, mood changes.   Objective Blood pressure 132/70, pulse 120, height 5\' 3"  (1.6 m), weight 135 lb (61.236 kg), SpO2 98 %.  General: No apparent distress alert and oriented x3 mood and affect normal, dressed appropriately.  HEENT: Pupils equal, extraocular movements intact  Respiratory: Patient's speak in full sentences and does not appear short of breath  Cardiovascular: No lower extremity edema, non tender, no erythema  Skin: Warm dry intact with no signs of infection or rash on extremities or on axial skeleton.  Abdomen: Soft nontender  Neuro: Cranial nerves II through XII are intact, neurovascularly intact in all extremities with 2+ DTRs and 2+ pulses.  Lymph: No lymphadenopathy of posterior or anterior cervical chain or axillae bilaterally.  Gait normal with good balance and coordination.  MSK:  Non tender with full range of motion and good stability and symmetric  strength and tone of shoulders, elbows, wrist, hip, knee and ankles bilaterally.  Neck: Inspection unremarkable. No palpable stepoffs. Negative Spurling's maneuver. Full range of motion noted. Grip strength and sensation normal in bilateral hands Strength good C4 to T1 distribution No sensory change to C4 to T1 Negative Hoffman sign bilaterally Reflexes normal  OMT Physical Exam     Cervical  C2 flexed rotated and side bent right but significantly less than previous exam C3 flexed rotated and side bent left knee and finding C6 flexed rotated and side bent right  Thoracic T2 extended rotated and side bent right   Lumbar L2 flexed rotated and side bent left  Sacrum Left on left  Illium Left anterior ilium      Impression and Recommendations:     This case required medical decision making of moderate complexity.

## 2014-07-07 NOTE — Progress Notes (Signed)
Pre visit review using our clinic review tool, if applicable. No additional management support is needed unless otherwise documented below in the visit note. 

## 2014-07-07 NOTE — Assessment & Plan Note (Signed)
Patient is doing significant better. We discussed icing regimen and home exercises. We discussed continuing the postural changes. Patient continue with the vitamins and continuing dry needling as long as it continues to improve. Patient will slowly titrate off some of the other medications were discussed. Patient will come back and see me again in 5-6 weeks for further evaluation.

## 2014-07-10 ENCOUNTER — Ambulatory Visit: Payer: 59 | Attending: Family Medicine | Admitting: Physical Therapy

## 2014-07-10 DIAGNOSIS — R51 Headache: Secondary | ICD-10-CM | POA: Diagnosis present

## 2014-07-10 DIAGNOSIS — G4486 Cervicogenic headache: Secondary | ICD-10-CM

## 2014-07-10 DIAGNOSIS — M9901 Segmental and somatic dysfunction of cervical region: Secondary | ICD-10-CM | POA: Insufficient documentation

## 2014-07-10 DIAGNOSIS — M999 Biomechanical lesion, unspecified: Secondary | ICD-10-CM

## 2014-07-10 NOTE — Therapy (Signed)
[place], [place], [address] Phone: [phone]   Fax:  [phone]  Physical Therapy Treatment  Patient Details  Name: Caitlyn [name] MRN: [id number] Date of Birth: 1975/07/18 Referring Provider:  Carlos Levering, PA-C  Encounter Date: 07/10/2014      PT End of Session - 07/10/14 1022    Visit Number 2   Number of Visits 8   Date for PT Re-Evaluation 08/21/14   PT Start Time 0800   PT Stop Time 2122   PT Time Calculation (min) 55 min   Activity Tolerance Patient tolerated treatment well      Past Medical History  Diagnosis Date  . Headache     No past surgical history on file.  There were no vitals filed for this visit.  Visit Diagnosis:  Nonallopathic lesion of cervical region  Cervicogenic headache      Subjective Assessment - 07/10/14 0759    Subjective Saw Dr. Tamala Julian and doing well.  1 H/A in the past month.  Mild soreness after last visit.  Did some mild stretching and decompressing exercise.  Upper traps generally sore.  Tight T-spine.     Currently in Pain? Yes   Pain Score 1    Pain Location Neck   Pain Orientation Right                         [facility] Adult PT Treatment/Exercise - 07/10/14 1019    Moist Heat Therapy   Number Minutes Moist Heat 10 Minutes   Moist Heat Location Cervical   Manual Therapy   Manual Therapy Myofascial release;Soft tissue mobilization;Muscle Energy Technique   Soft tissue mobilization upper traps, levator scap, suboccipitals, cervical multifidi   Myofascial Release bilateral suboccipitals upper traps levator          Trigger Point Dry Needling - 07/10/14 1021    Consent Given? Yes   Education Handout Provided No   Muscles Treated Upper Body Upper trapezius;Suboccipitals muscle group;Levator scapulae  bilateral cervical multifidi   Upper Trapezius Response Twitch reponse elicited;Palpable increased muscle length   SubOccipitals Response Twitch response elicited;Palpable increased muscle length   Levator Scapulae Response Twitch response elicited;Palpable increased muscle length       Performed bilaterally.            PT Long Term Goals - 07/10/14 1111    PT LONG TERM GOAL #1   Title Patient will be independent in safe, self progression of HEP for self management    Time 8   Period Weeks   Status On-going   PT LONG TERM GOAL #2   Title Patient will report a 50% improvement in headache occurrence and/or intensity   Time 8   Period Weeks   Status On-going   PT LONG TERM GOAL #3   Title Cervical AROM improved to WNLs with decreased asymmetries noted.   Time 8   Period Weeks   Status On-going   PT LONG TERM GOAL #4   Title FOTO functional outcome score improved to 25% indicating improved function with less pain   Time 8   Period Weeks   Status On-going               Plan - 07/10/14 1022    Clinical Impression Statement Patient with tender points right C4-5 multifidi and right suboccipital.  Increased muscle length following.  Multiple twitch responses produced in suboccipital region.   PT Next Visit  Plan assess response to dry needling and continue as needed; manual techniques; HEP progression; modalities PRN        Problem List Patient Active Problem List   Diagnosis Date Noted  . Cervicogenic headache 04/10/2014  . Nonallopathic lesion of cervical region 04/10/2014  . Nonallopathic lesion of thoracic region 04/10/2014  . Nonallopathic lesion-rib cage 04/10/2014    Alvera Singh 07/10/2014, 11:15 AM  [facility] [address] [place], [place], [address] Phone: [phone]   Fax:  [phone]  Ruben Im, PT 07/10/2014 11:15 AM Phone: [phone] Fax: [phone]

## 2014-07-24 ENCOUNTER — Ambulatory Visit: Payer: 59 | Admitting: Physical Therapy

## 2014-08-07 ENCOUNTER — Ambulatory Visit (INDEPENDENT_AMBULATORY_CARE_PROVIDER_SITE_OTHER): Payer: 59 | Admitting: Family Medicine

## 2014-08-07 ENCOUNTER — Ambulatory Visit: Payer: 59 | Attending: Family Medicine | Admitting: Physical Therapy

## 2014-08-07 ENCOUNTER — Encounter: Payer: Self-pay | Admitting: Family Medicine

## 2014-08-07 VITALS — BP 134/78 | HR 89 | Ht 63.0 in | Wt 136.0 lb

## 2014-08-07 DIAGNOSIS — M9908 Segmental and somatic dysfunction of rib cage: Secondary | ICD-10-CM | POA: Diagnosis not present

## 2014-08-07 DIAGNOSIS — M9902 Segmental and somatic dysfunction of thoracic region: Secondary | ICD-10-CM

## 2014-08-07 DIAGNOSIS — R51 Headache: Secondary | ICD-10-CM | POA: Diagnosis not present

## 2014-08-07 DIAGNOSIS — G4486 Cervicogenic headache: Secondary | ICD-10-CM

## 2014-08-07 DIAGNOSIS — M9901 Segmental and somatic dysfunction of cervical region: Secondary | ICD-10-CM

## 2014-08-07 DIAGNOSIS — M999 Biomechanical lesion, unspecified: Secondary | ICD-10-CM

## 2014-08-07 NOTE — Assessment & Plan Note (Signed)
Decision today to treat with OMT was based on Physical Exam  After verbal consent patient was treated with HVLA, ME techniques in cervical, thoracic, lumbar and sacral areas  Patient tolerated the procedure well with improvement in symptoms  Patient given exercises, stretches and lifestyle modifications  See medications in patient instructions if given  Patient will follow up in 6 weeks      

## 2014-08-07 NOTE — Patient Instructions (Addendum)
Great to see you Sorry was running a little late.  Happy 4th! You are doing awesome Continue what you are doing! No changes in medicine See me again in 6 weeks.

## 2014-08-07 NOTE — Progress Notes (Signed)
  [name] [place], [place] [address] Phone: [phone] Subjective:     CC: Headaches and neck pain follow-up  [id number] Caitlyn [name] is a 39 y.o. female coming in with complaint of headaches and neck pain. Patient has been followed by neurology previously. Patient has been responding very well to osteopathic manipulation previously. Patient states that the headache seemed to be a little less than usual. Patient states that she is actually only had one headache since last exam. Patient is very happy with the results. Has been doing dry needling and is no longer taking any medications on a regular basis. Patient states that she is about 99% better and is very happy.      Past medical history, social, surgical and family history all reviewed in electronic medical record.   Review of Systems: No headache, visual changes, nausea, vomiting, diarrhea, constipation, dizziness, abdominal pain, skin rash, fevers, chills, night sweats, weight loss, swollen lymph nodes, body aches, joint swelling, muscle aches, chest pain, shortness of breath, mood changes.   Objective Blood pressure 134/78, pulse 89, height 5\' 3"  (1.6 m), weight 136 lb (61.689 kg), SpO2 97 %.  General: No apparent distress alert and oriented x3 mood and affect normal, dressed appropriately.  HEENT: Pupils equal, extraocular movements intact  Respiratory: Patient's speak in full sentences and does not appear short of breath  Cardiovascular: No lower extremity edema, non tender, no erythema  Skin: Warm dry intact with no signs of infection or rash on extremities or on axial skeleton.  Abdomen: Soft nontender  Neuro: Cranial nerves II through XII are intact, neurovascularly intact in all extremities with 2+ DTRs and 2+ pulses.  Lymph: No lymphadenopathy of posterior or anterior cervical chain or axillae bilaterally.  Gait normal with good balance and coordination.  MSK:  Non  tender with full range of motion and good stability and symmetric strength and tone of shoulders, elbows, wrist, hip, knee and ankles bilaterally.  Neck: Inspection unremarkable. No palpable stepoffs. Negative Spurling's maneuver. Full range of motion noted. Grip strength and sensation normal in bilateral hands Strength good C4 to T1 distribution No sensory change to C4 to T1 Negative Hoffman sign bilaterally Reflexes normal  OMT Physical Exam     Cervical  C2 flexed rotated and side bent right   C6 flexed rotated and side bent right  Thoracic T5 extended rotated and side bent right   Lumbar L2 flexed rotated and side bent left  Sacrum Left on left  Illium Neutral      Impression and Recommendations:     This case required medical decision making of moderate complexity.

## 2014-08-07 NOTE — Progress Notes (Signed)
Pre visit review using our clinic review tool, if applicable. No additional management support is needed unless otherwise documented below in the visit note. 

## 2014-08-07 NOTE — Assessment & Plan Note (Signed)
Patient is responding very well and his only had one headache since last visit. Patient is doing so well we will space her up to 6 weeks. Encourage her to stay active and continue working on the posture. Patient will continue with the natural vitamin supplementation. Patient finish with the dry needling over the course of the next 2 weeks. Patient will come back and see me again in 6 weeks for further evaluation and treatment.

## 2014-08-07 NOTE — Therapy (Signed)
[place], [place], [address] Phone: [phone]   Fax:  [phone]  Physical Therapy Treatment/Discharge summary  Patient Details  Name: Caitlyn [name] MRN: [id number] Date of Birth: 06-07-1975 Referring Provider:  Carlos Levering, PA-C  Encounter Date: 08/07/2014      PT End of Session - 08/07/14 1129    Visit Number 3   Number of Visits 8   Date for PT Re-Evaluation 08/21/14   PT Start Time 0928   PT Stop Time 1008   PT Time Calculation (min) 40 min   Activity Tolerance Patient tolerated treatment well      Past Medical History  Diagnosis Date  . Headache     No past surgical history on file.  There were no vitals filed for this visit.  Visit Diagnosis:  Nonallopathic lesion of cervical region  Cervicogenic headache          [facility] PT Assessment - 08/07/14 0001    AROM   Cervical Flexion 60   Cervical Extension 60   Cervical - Right Side Bend 55   Cervical - Left Side Bend 55   Cervical - Right Rotation 60   Cervical - Left Rotation 60                     [facility] Adult PT Treatment/Exercise - 08/07/14 0001    Manual Therapy   Manual Therapy Myofascial release;Soft tissue mobilization;Muscle Energy Technique   Soft tissue mobilization upper traps, levator scap, suboccipitals, cervical multifidi   Myofascial Release suboccipital release 3 min          Trigger Point Dry Needling - 08/07/14 1128    Consent Given? Yes   Muscles Treated Upper Body Upper trapezius;Suboccipitals muscle group  right cervical multifidi   Upper Trapezius Response Twitch reponse elicited;Palpable increased muscle length   SubOccipitals Response Palpable increased muscle length   Levator Scapulae Response Twitch response elicited;Palpable increased muscle length      Right side primarily             PT Long Term Goals - 08/07/14 1131    PT LONG TERM GOAL #1   Title Patient  will be independent in safe, self progression of HEP for self management    Status Achieved   PT LONG TERM GOAL #2   Title Patient will report a 50% improvement in headache occurrence and/or intensity   Status Achieved   PT LONG TERM GOAL #3   Title Cervical AROM improved to WNLs with decreased asymmetries noted.   Status Achieved   PT LONG TERM GOAL #4   Title FOTO functional outcome score improved to 25% indicating improved function with less pain   Status Unable to assess               Plan - 08/07/14 1129    Clinical Impression Statement The patient reports improved cervical ROM especially rotation.  Decreased subocciptal tenderness and improved muscle length noted.  One headache in 6 weeks.  All goals met.  Discharge from PT with all goals met.  Patient is independent in scapular stabilization/strengthening HEP.       PHYSICAL THERAPY DISCHARGE SUMMARY  Visits from Start of Care: 3  Current functional level related to goals / functional outcomes: All goals met.  See clinical impressions above.   Remaining deficits: None noted   Education / Equipment: Independent with HEP Plan: Patient agrees to discharge.  Patient goals were met.  Patient is being discharged due to meeting the stated rehab goals.  ?????       Ruben Im, PT 08/07/2014 11:34 AM Phone: [phone] Fax: [phone]     Problem List Patient Active Problem List   Diagnosis Date Noted  . Cervicogenic headache 04/10/2014  . Nonallopathic lesion of cervical region 04/10/2014  . Nonallopathic lesion of thoracic region 04/10/2014  . Nonallopathic lesion-rib cage 04/10/2014    Alvera Singh 08/07/2014, 11:32 AM  [facility] [address] [place], [place], [address] Phone: [phone]   Fax:  [phone]

## 2014-09-18 ENCOUNTER — Encounter: Payer: Self-pay | Admitting: Family Medicine

## 2014-09-18 ENCOUNTER — Ambulatory Visit (INDEPENDENT_AMBULATORY_CARE_PROVIDER_SITE_OTHER): Payer: 59 | Admitting: Family Medicine

## 2014-09-18 VITALS — BP 118/76 | HR 106

## 2014-09-18 DIAGNOSIS — M9901 Segmental and somatic dysfunction of cervical region: Secondary | ICD-10-CM | POA: Diagnosis not present

## 2014-09-18 DIAGNOSIS — M999 Biomechanical lesion, unspecified: Secondary | ICD-10-CM

## 2014-09-18 DIAGNOSIS — M9908 Segmental and somatic dysfunction of rib cage: Secondary | ICD-10-CM | POA: Diagnosis not present

## 2014-09-18 DIAGNOSIS — M9902 Segmental and somatic dysfunction of thoracic region: Secondary | ICD-10-CM

## 2014-09-18 DIAGNOSIS — R51 Headache: Secondary | ICD-10-CM | POA: Diagnosis not present

## 2014-09-18 DIAGNOSIS — G4486 Cervicogenic headache: Secondary | ICD-10-CM

## 2014-09-18 NOTE — Assessment & Plan Note (Signed)
Decision today to treat with OMT was based on Physical Exam  After verbal consent patient was treated with HVLA, ME techniques in cervical, thoracic, lumbar and sacral areas  Patient tolerated the procedure well with improvement in symptoms  Patient given exercises, stretches and lifestyle modifications  See medications in patient instructions if given  Patient will follow up in 6 weeks      

## 2014-09-18 NOTE — Assessment & Plan Note (Signed)
Patient is doing significant a better. Encourage her to continue to do the same exercises, icing, over-the-counter natural supplementations and patient will continue to come for osteopathic manipulation. Because she is doing so well we will see her again in 6-8 weeks for further evaluation and treatment.

## 2014-09-18 NOTE — Patient Instructions (Addendum)
I am very impressed! Whatever you are doing continue it.  You should be confident you can control it.  Stay active and dont let it stop you.  You are getting too easy so lets try 6-8 weeks!!! Have a good weekend.

## 2014-09-18 NOTE — Progress Notes (Signed)
  [name] [place], [place] [address] Phone: [phone] Subjective:     CC: Headaches and neck pain follow-up  [id number] Caitlyn [name] is a 39 y.o. female coming in with complaint of headaches and neck pain. Patient has been followed by neurology previously. Patient has been responding very well to osteopathic manipulation previously. Patient was doing significantly better at last exam. Patient states that the different vitamins as well as the exercises seem to be beneficial. Patient denies any significant new symptoms. Does have the muscle relaxer for break through pain when needed. Patient states overall she has been doing tremendously better. Patient states that she is been more active and not having any headaches. States that she can do some self manipulation and seems to resolving on its own.      Past medical history, social, surgical and family history all reviewed in electronic medical record.   Review of Systems: No headache, visual changes, nausea, vomiting, diarrhea, constipation, dizziness, abdominal pain, skin rash, fevers, chills, night sweats, weight loss, swollen lymph nodes, body aches, joint swelling, muscle aches, chest pain, shortness of breath, mood changes.   Objective Blood pressure 118/76, pulse 106, SpO2 99 %.  General: No apparent distress alert and oriented x3 mood and affect normal, dressed appropriately.  HEENT: Pupils equal, extraocular movements intact  Respiratory: Patient's speak in full sentences and does not appear short of breath  Cardiovascular: No lower extremity edema, non tender, no erythema  Skin: Warm dry intact with no signs of infection or rash on extremities or on axial skeleton.  Abdomen: Soft nontender  Neuro: Cranial nerves II through XII are intact, neurovascularly intact in all extremities with 2+ DTRs and 2+ pulses.  Lymph: No lymphadenopathy of posterior or anterior cervical chain or  axillae bilaterally.  Gait normal with good balance and coordination.  MSK:  Non tender with full range of motion and good stability and symmetric strength and tone of shoulders, elbows, wrist, hip, knee and ankles bilaterally.  Neck: Inspection unremarkable. No palpable stepoffs. Negative Spurling's maneuver. Full range of motion noted. Grip strength and sensation normal in bilateral hands Strength good C4 to T1 distribution No sensory change to C4 to T1 Negative Hoffman sign bilaterally Reflexes normal  OMT Physical Exam     Cervical  C2 flexed rotated and side bent right   C6 flexed rotated and side bent right  Thoracic T5 extended rotated and side bent right   Lumbar L2 flexed rotated and side bent left  Sacrum Left on left  Illium Neutral      Impression and Recommendations:     This case required medical decision making of moderate complexity.

## 2014-11-05 ENCOUNTER — Ambulatory Visit (INDEPENDENT_AMBULATORY_CARE_PROVIDER_SITE_OTHER): Payer: 59 | Admitting: Family Medicine

## 2014-11-05 ENCOUNTER — Encounter: Payer: Self-pay | Admitting: Family Medicine

## 2014-11-05 VITALS — BP 132/78 | HR 108 | Ht 65.0 in | Wt 136.0 lb

## 2014-11-05 DIAGNOSIS — M999 Biomechanical lesion, unspecified: Secondary | ICD-10-CM

## 2014-11-05 DIAGNOSIS — R51 Headache: Secondary | ICD-10-CM | POA: Diagnosis not present

## 2014-11-05 DIAGNOSIS — M9908 Segmental and somatic dysfunction of rib cage: Secondary | ICD-10-CM | POA: Diagnosis not present

## 2014-11-05 DIAGNOSIS — G4486 Cervicogenic headache: Secondary | ICD-10-CM

## 2014-11-05 DIAGNOSIS — M9902 Segmental and somatic dysfunction of thoracic region: Secondary | ICD-10-CM

## 2014-11-05 DIAGNOSIS — M9901 Segmental and somatic dysfunction of cervical region: Secondary | ICD-10-CM

## 2014-11-05 NOTE — Progress Notes (Signed)
  [name] [place], [place] [address] Phone: [phone] Subjective:     CC: Headaches and neck pain follow-up  [id number] Caitlyn [name] is a 39 y.o. female coming in with complaint of headaches and neck pain. Patient has been followed by neurology previously. Patient has been responding very well to osteopathic manipulation previously. Patient states she is doing much better and not even having a headache. Patient has not had a headache or any neck pain for greater than one month. Patient is very happy with the results. Mild tightness of hip flexors but otherwise feeling very well.     Past medical history, social, surgical and family history all reviewed in electronic medical record.   Review of Systems: No headache, visual changes, nausea, vomiting, diarrhea, constipation, dizziness, abdominal pain, skin rash, fevers, chills, night sweats, weight loss, swollen lymph nodes, body aches, joint swelling, muscle aches, chest pain, shortness of breath, mood changes.   Objective Blood pressure 132/78, pulse 108, height 5\' 5"  (1.651 m), weight 136 lb (61.689 kg), SpO2 99 %.  General: No apparent distress alert and oriented x3 mood and affect normal, dressed appropriately.  HEENT: Pupils equal, extraocular movements intact  Respiratory: Patient's speak in full sentences and does not appear short of breath  Cardiovascular: No lower extremity edema, non tender, no erythema  Skin: Warm dry intact with no signs of infection or rash on extremities or on axial skeleton.  Abdomen: Soft nontender  Neuro: Cranial nerves II through XII are intact, neurovascularly intact in all extremities with 2+ DTRs and 2+ pulses.  Lymph: No lymphadenopathy of posterior or anterior cervical chain or axillae bilaterally.  Gait normal with good balance and coordination.  MSK:  Non tender with full range of motion and good stability and symmetric strength and tone of  shoulders, elbows, wrist, hip, knee and ankles bilaterally.  Neck: Inspection unremarkable. No palpable stepoffs. Negative Spurling's maneuver. Full range of motion noted. Grip strength and sensation normal in bilateral hands Strength good C4 to T1 distribution No sensory change to C4 to T1 Negative Hoffman sign bilaterally Reflexes normal  OMT Physical Exam   Cervical  C2 flexed rotated and side bent right   Thoracic T5 extended rotated and side bent right  T8 extended rotated and side bent left  Lumbar L2 flexed rotated and side bent left L4 flexed rotated and side bent right  Sacrum Left on left  Illium Neutral      Impression and Recommendations:     This case required medical decision making of moderate complexity.

## 2014-11-05 NOTE — Assessment & Plan Note (Signed)
Decision today to treat with OMT was based on Physical Exam  After verbal consent patient was treated with HVLA, ME techniques in cervical, thoracic, lumbar and sacral areas  Patient tolerated the procedure well with improvement in symptoms  Patient given exercises, stretches and lifestyle modifications  See medications in patient instructions if given  Patient will follow up in 10 weeks

## 2014-11-05 NOTE — Assessment & Plan Note (Signed)
Patient is doing remarkably well at this time. Patient will continue the same regimen that she is doing at this time and does have a muscle relaxer if needed. Patient will come back and see me again in 10-12 weeks for further evaluation and treatment.

## 2014-11-05 NOTE — Patient Instructions (Signed)
You are my poster child!! Keep doing what you are doing! Ice when you need it Check the seat in your car or towel under thighs.  Consider 10 weeks.

## 2014-11-05 NOTE — Progress Notes (Signed)
Pre visit review using our clinic review tool, if applicable. No additional management support is needed unless otherwise documented below in the visit note. 

## 2015-01-15 ENCOUNTER — Ambulatory Visit: Payer: 59 | Admitting: Family Medicine

## 2015-02-12 ENCOUNTER — Encounter: Payer: Self-pay | Admitting: Family Medicine

## 2015-02-12 ENCOUNTER — Ambulatory Visit (INDEPENDENT_AMBULATORY_CARE_PROVIDER_SITE_OTHER): Payer: 59 | Admitting: Family Medicine

## 2015-02-12 VITALS — BP 136/82 | HR 98

## 2015-02-12 DIAGNOSIS — M9903 Segmental and somatic dysfunction of lumbar region: Secondary | ICD-10-CM | POA: Diagnosis not present

## 2015-02-12 DIAGNOSIS — M9902 Segmental and somatic dysfunction of thoracic region: Secondary | ICD-10-CM | POA: Diagnosis not present

## 2015-02-12 DIAGNOSIS — M9901 Segmental and somatic dysfunction of cervical region: Secondary | ICD-10-CM

## 2015-02-12 DIAGNOSIS — R51 Headache: Secondary | ICD-10-CM

## 2015-02-12 DIAGNOSIS — M999 Biomechanical lesion, unspecified: Secondary | ICD-10-CM

## 2015-02-12 DIAGNOSIS — G4486 Cervicogenic headache: Secondary | ICD-10-CM

## 2015-02-12 NOTE — Progress Notes (Signed)
  [name] [place], [place] [address] Phone: [phone] Subjective:     CC: Headaches and neck pain follow-up  [id number] Caitlyn [name] is a 40 y.o. female coming in with complaint of headaches and neck pain. Patient has been followed by neurology previously. Patient has been responding has not been seen for greater than 3 months. Patient had been doing very well. Some intermittent headaches but nothing that is severe. Has not even had to take any anti-inflammatories on a regular basis. Not taking any other medications such as even the muscle relaxer since last visit. Continues to stay active and has no pain that stops her from any activity. Feels some increasing in tightness recently. Possibly increasing frequency of the headaches even though there still significant a less than before her initial visit. No new symptoms just some mild worsening of previous symptoms.     Past medical history, social, surgical and family history all reviewed and no pertinent info pertaining to chief complaint. All other was reviewed using the EMR.    Review of Systems: No headache, visual changes, nausea, vomiting, diarrhea, constipation, dizziness, abdominal pain, skin rash, fevers, chills, night sweats, weight loss, swollen lymph nodes, body aches, joint swelling, muscle aches, chest pain, shortness of breath, mood changes.   Objective Blood pressure 136/82, pulse 98, SpO2 99 %.  General: No apparent distress alert and oriented x3 mood and affect normal, dressed appropriately.  HEENT: Pupils equal, extraocular movements intact  Respiratory: Patient's speak in full sentences and does not appear short of breath  Cardiovascular: No lower extremity edema, non tender, no erythema  Skin: Warm dry intact with no signs of infection or rash on extremities or on axial skeleton.  Abdomen: Soft nontender  Neuro: Cranial nerves II through XII are intact,  neurovascularly intact in all extremities with 2+ DTRs and 2+ pulses.  Lymph: No lymphadenopathy of posterior or anterior cervical chain or axillae bilaterally.  Gait normal with good balance and coordination.  MSK:  Non tender with full range of motion and good stability and symmetric strength and tone of shoulders, elbows, wrist, hip, knee and ankles bilaterally.  Neck: Inspection unremarkable. No palpable stepoffs. Negative Spurling's maneuver. Full range of motion noted. Grip strength and sensation normal in bilateral hands Strength good C4 to T1 distribution No sensory change to C4 to T1 Negative Hoffman sign bilaterally Reflexes normal  no change from previous exam  OMT Physical Exam   Cervical  C2 flexed rotated and side bent right   Thoracic  T3 extended rotated and side bent right T5 extended rotated and side bent right  T9 extended rotated and side bent left  Lumbar L2 flexed rotated and side bent left L4 flexed rotated and side bent right  Sacrum Left on left  Illium Neutral      Impression and Recommendations:     This case required medical decision making of moderate complexity.

## 2015-02-12 NOTE — Assessment & Plan Note (Signed)
Decision today to treat with OMT was based on Physical Exam  After verbal consent patient was treated with HVLA, ME techniques in cervical, thoracic, lumbar and sacral areas  Patient tolerated the procedure well with improvement in symptoms  Patient given exercises, stretches and lifestyle modifications  See medications in patient instructions if given  Patient will follow up in 10-12 weeks

## 2015-02-12 NOTE — Assessment & Plan Note (Signed)
Patient is doing well. Encourage tilt to work on Engineer, building services at work. We discussed continuing stay active. Patient has improved her muscular imbalances significantly since initial visit. Patient is doing well with the three-month intervals and we continue the same treatment.

## 2015-02-12 NOTE — Patient Instructions (Signed)
Good to see you Ice is your friend Keep doing what you are wanting because you are doing great See me again in 10-12 weeks.

## 2015-02-17 DIAGNOSIS — N939 Abnormal uterine and vaginal bleeding, unspecified: Secondary | ICD-10-CM | POA: Diagnosis not present

## 2015-04-09 DIAGNOSIS — Z0000 Encounter for general adult medical examination without abnormal findings: Secondary | ICD-10-CM | POA: Diagnosis not present

## 2015-04-09 DIAGNOSIS — J309 Allergic rhinitis, unspecified: Secondary | ICD-10-CM | POA: Diagnosis not present

## 2015-04-09 DIAGNOSIS — Z13220 Encounter for screening for lipoid disorders: Secondary | ICD-10-CM | POA: Diagnosis not present

## 2015-04-09 DIAGNOSIS — Z131 Encounter for screening for diabetes mellitus: Secondary | ICD-10-CM | POA: Diagnosis not present

## 2015-04-09 DIAGNOSIS — L239 Allergic contact dermatitis, unspecified cause: Secondary | ICD-10-CM | POA: Diagnosis not present

## 2015-04-09 MED FILL — TRIAMCINOLONE 0.1% CREAM: 0.1 | 30 days supply | Qty: 50 | Fill #0

## 2015-06-07 ENCOUNTER — Telehealth: Payer: Self-pay | Admitting: Family Medicine

## 2015-06-07 NOTE — Telephone Encounter (Signed)
Pt is needing a neck adjustment and she is off next Monday 5/8 and was hoping to get an appt. I scheduled her next availability and she is wondering if you get a cancellation anytime on the 8th if you could please call her.

## 2015-06-11 NOTE — Telephone Encounter (Signed)
Spoke to pt, scheduled her for 5.8.17.

## 2015-06-14 ENCOUNTER — Ambulatory Visit (INDEPENDENT_AMBULATORY_CARE_PROVIDER_SITE_OTHER): Payer: 59 | Admitting: Family Medicine

## 2015-06-14 ENCOUNTER — Encounter: Payer: Self-pay | Admitting: Family Medicine

## 2015-06-14 VITALS — BP 132/76 | HR 88 | Wt 135.0 lb

## 2015-06-14 DIAGNOSIS — M9902 Segmental and somatic dysfunction of thoracic region: Secondary | ICD-10-CM

## 2015-06-14 DIAGNOSIS — M9908 Segmental and somatic dysfunction of rib cage: Secondary | ICD-10-CM

## 2015-06-14 DIAGNOSIS — M9901 Segmental and somatic dysfunction of cervical region: Secondary | ICD-10-CM

## 2015-06-14 DIAGNOSIS — R51 Headache: Secondary | ICD-10-CM

## 2015-06-14 DIAGNOSIS — M999 Biomechanical lesion, unspecified: Secondary | ICD-10-CM

## 2015-06-14 DIAGNOSIS — G4486 Cervicogenic headache: Secondary | ICD-10-CM

## 2015-06-14 NOTE — Progress Notes (Signed)
  [name] [place], [place] [address] Phone: [phone] Subjective:     CC: Headaches and neck pain follow-up  [id number] Caitlyn [name] is a 40 y.o. female coming in with complaint of headaches and neck pain. Patient has been followed by neurology previously. Patient has been responding has not been seen for greater than 3 months. Patient is actually been greater than 4 months. Overall doing well. Starting have some mild tightness. No significant headaches. Patient states though that she can feel more discomfort then she's had previously. She does he exercises she seems to do well he does well with the over-the-counter natural supplementations.     Past Medical History  Diagnosis Date  . Headache    No past surgical history on file. Social History  Substance Use Topics  . Smoking status: Former Research scientist (life sciences)  . Smokeless tobacco: Never Used  . Alcohol Use: 0.0 oz/week    0 Standard drinks or equivalent per week     Comment: social    No Known Allergies Family History  Problem Relation Age of Onset  . Cancer Mother   . Cirrhosis Father   . Cancer Paternal Grandmother     ovarian   . Heart failure Paternal Grandfather      Past medical history, social, surgical and family history all reviewed and no pertinent info pertaining to chief complaint. All other was reviewed using the EMR.    Review of Systems: No headache, visual changes, nausea, vomiting, diarrhea, constipation, dizziness, abdominal pain, skin rash, fevers, chills, night sweats, weight loss, swollen lymph nodes, body aches, joint swelling, muscle aches, chest pain, shortness of breath, mood changes.   Objective Blood pressure 132/76, pulse 88, weight 135 lb (61.236 kg).  General: No apparent distress alert and oriented x3 mood and affect normal, dressed appropriately.  HEENT: Pupils equal, extraocular movements intact  Respiratory: Patient's speak in full sentences and  does not appear short of breath  Cardiovascular: No lower extremity edema, non tender, no erythema  Skin: Warm dry intact with no signs of infection or rash on extremities or on axial skeleton.  Abdomen: Soft nontender  Neuro: Cranial nerves II through XII are intact, neurovascularly intact in all extremities with 2+ DTRs and 2+ pulses.  Lymph: No lymphadenopathy of posterior or anterior cervical chain or axillae bilaterally.  Gait normal with good balance and coordination.  MSK:  Non tender with full range of motion and good stability and symmetric strength and tone of shoulders, elbows, wrist, hip, knee and ankles bilaterally.  Neck: Inspection unremarkable. No palpable stepoffs. Negative Spurling's maneuver. Full range of motion noted. Grip strength and sensation normal in bilateral hands Strength good C4 to T1 distribution No sensory change to C4 to T1 Negative Hoffman sign bilaterally Reflexes normal  no change from previous exam  OMT Physical Exam   Cervical  C2 flexed rotated and side bent right   Thoracic  T3 extended rotated and side bent right T5 extended rotated and side bent right   Lumbar L2 flexed rotated and side bent left L4 flexed rotated and side bent right  Sacrum Left on left  Illium Neutral  Same pattern as previously.   Impression and Recommendations:     This case required medical decision making of moderate complexity.

## 2015-06-14 NOTE — Assessment & Plan Note (Signed)
Decision today to treat with OMT was based on Physical Exam  After verbal consent patient was treated with HVLA, ME techniques in cervical, thoracic, lumbar and sacral areas  Patient tolerated the procedure well with improvement in symptoms  Patient given exercises, stretches and lifestyle modifications  See medications in patient instructions if given  Patient will follow up in 12-16 weeks

## 2015-06-14 NOTE — Assessment & Plan Note (Signed)
Patient is overall doing well. Has not had any significant headache. Has not used her muscle relaxer in quite some time. We will make no significant changes. Patient will likely need to see me every 3-4 months for further evaluation and treatment.

## 2015-06-14 NOTE — Patient Instructions (Signed)
Good to see you Its been a while which is great  Continue what you are doing We may just want to do this every 3 months to avoid the pain you get  I am impressed!

## 2015-06-22 ENCOUNTER — Ambulatory Visit: Payer: 59 | Admitting: Family Medicine

## 2015-07-30 DIAGNOSIS — Z6823 Body mass index (BMI) 23.0-23.9, adult: Secondary | ICD-10-CM | POA: Diagnosis not present

## 2015-07-30 DIAGNOSIS — Z01419 Encounter for gynecological examination (general) (routine) without abnormal findings: Secondary | ICD-10-CM | POA: Diagnosis not present

## 2015-09-16 NOTE — Assessment & Plan Note (Addendum)
Decision today to treat with OMT was based on Physical Exam  After verbal consent patient was treated with HVLA, ME techniques in cervical, thoracic, lumbar and sacral areas  Patient tolerated the procedure well with improvement in symptoms  Patient given exercises, stretches and lifestyle modifications  See medications in patient instructions if given  Patient will follow up in 4 weeks   

## 2015-09-16 NOTE — Assessment & Plan Note (Addendum)
Patient has been doing remarkably well with conservative therapy. Recently has had an exacerbation. We discussed over-the-counter medications a could be beneficial. Encourage her to do the posture and ergonomics are more regular basis. We discussed icing regimen. Patient come back and see me again in 4 weeks for further evaluation and treatment.

## 2015-09-16 NOTE — Progress Notes (Signed)
  [name] [address], [place] [address] Phone: [phone] Subjective:     CC: Headaches and neck pain follow-up  [id number]  Caitlyn [name] is a 40 y.o. female coming in with complaint of headaches and neck pain. Patient has been followed by neurology previously. Patient has been responding has not been seen for greater than 3 months Patient overall has been doing very well. Patient is a physical therapist. Patient states He is having some worsening pain recently. Having more headaches most days a week over the course last 3 weeks or. Does not know exactly what has changed. Patient states that it seems to be a little more abnormal. Had been well controlled for a while.     Past Medical History:  Diagnosis Date  . Headache    No past surgical history on file. Social History  Substance Use Topics  . Smoking status: Former Research scientist (life sciences)  . Smokeless tobacco: Never Used  . Alcohol use 0.0 oz/week     Comment: social    No Known Allergies Family History  Problem Relation Age of Onset  . Cancer Mother   . Cirrhosis Father   . Cancer Paternal Grandmother     ovarian   . Heart failure Paternal Grandfather      Past medical history, social, surgical and family history all reviewed and no pertinent info pertaining to chief complaint. All other was reviewed using the EMR.    Review of Systems: No visual changes, nausea, vomiting, diarrhea, constipation, dizziness, abdominal pain, skin rash, fevers, chills, night sweats, weight loss, swollen lymph nodes, body aches, joint swelling, muscle aches, chest pain, shortness of breath, mood changes.   Objective  Blood pressure 132/80, pulse 90, resp. rate 16, weight 135 lb (61.2 kg), SpO2 98 %.  General: No apparent distress alert and oriented x3 mood and affect normal, dressed appropriately.  HEENT: Pupils equal, extraocular movements intact  Respiratory: Patient's speak in full sentences and does  not appear short of breath  Cardiovascular: No lower extremity edema, non tender, no erythema  Skin: Warm dry intact with no signs of infection or rash on extremities or on axial skeleton.  Abdomen: Soft nontender  Neuro: Cranial nerves II through XII are intact, neurovascularly intact in all extremities with 2+ DTRs and 2+ pulses.  Lymph: No lymphadenopathy of posterior or anterior cervical chain or axillae bilaterally.  Gait normal with good balance and coordination.  MSK:  Non tender with full range of motion and good stability and symmetric strength and tone of shoulders, elbows, wrist, hip, knee and ankles bilaterally.  Neck: Inspection unremarkable. No palpable stepoffs. Negative Spurling's maneuver. Significant more tightness than previous exam with limited range of motion with right-sided side bending and left lateral rotation Grip strength and sensation normal in bilateral hands Strength good C4 to T1 distribution No sensory change to C4 to T1 Negative Hoffman sign bilaterally Reflexes normal   OMT Physical Exam   Cervical  C2 flexed rotated and side bent right   Thoracic  T3 extended rotated and side bent right T7 extended rotated and side bent right   Lumbar L2 flexed rotated and side bent left  Sacrum Left on left  Illium Neutral  Same pattern as previously.   Impression and Recommendations:     This case required medical decision making of moderate complexity.

## 2015-09-17 ENCOUNTER — Encounter: Payer: Self-pay | Admitting: Family Medicine

## 2015-09-17 ENCOUNTER — Ambulatory Visit (INDEPENDENT_AMBULATORY_CARE_PROVIDER_SITE_OTHER): Payer: 59 | Admitting: Family Medicine

## 2015-09-17 VITALS — BP 132/80 | HR 90 | Resp 16 | Wt 135.0 lb

## 2015-09-17 DIAGNOSIS — M999 Biomechanical lesion, unspecified: Secondary | ICD-10-CM

## 2015-09-17 DIAGNOSIS — R51 Headache: Secondary | ICD-10-CM

## 2015-09-17 DIAGNOSIS — G4486 Cervicogenic headache: Secondary | ICD-10-CM

## 2015-09-17 DIAGNOSIS — M9901 Segmental and somatic dysfunction of cervical region: Secondary | ICD-10-CM | POA: Diagnosis not present

## 2015-09-17 DIAGNOSIS — M9908 Segmental and somatic dysfunction of rib cage: Secondary | ICD-10-CM | POA: Diagnosis not present

## 2015-09-17 DIAGNOSIS — M9902 Segmental and somatic dysfunction of thoracic region: Secondary | ICD-10-CM | POA: Diagnosis not present

## 2015-09-17 NOTE — Patient Instructions (Signed)
God to see you  Ice when you need it Keep doing the exercises and posture Have a great weekend Flonase may be good See me again in 4-5 weeks

## 2015-09-17 NOTE — Progress Notes (Signed)
Pre visit review using our clinic review tool, if applicable. No additional management support is needed unless otherwise documented below in the visit note. 

## 2015-09-20 ENCOUNTER — Encounter: Payer: Self-pay | Admitting: Family Medicine

## 2015-09-30 ENCOUNTER — Encounter: Payer: Self-pay | Admitting: Family Medicine

## 2015-09-30 ENCOUNTER — Ambulatory Visit (INDEPENDENT_AMBULATORY_CARE_PROVIDER_SITE_OTHER): Payer: 59 | Admitting: Family Medicine

## 2015-09-30 VITALS — BP 132/70 | HR 81 | Wt 136.0 lb

## 2015-09-30 DIAGNOSIS — G4486 Cervicogenic headache: Secondary | ICD-10-CM

## 2015-09-30 DIAGNOSIS — M999 Biomechanical lesion, unspecified: Secondary | ICD-10-CM

## 2015-09-30 DIAGNOSIS — M9908 Segmental and somatic dysfunction of rib cage: Secondary | ICD-10-CM | POA: Diagnosis not present

## 2015-09-30 DIAGNOSIS — R51 Headache: Secondary | ICD-10-CM | POA: Diagnosis not present

## 2015-09-30 DIAGNOSIS — M9902 Segmental and somatic dysfunction of thoracic region: Secondary | ICD-10-CM | POA: Diagnosis not present

## 2015-09-30 DIAGNOSIS — M9901 Segmental and somatic dysfunction of cervical region: Secondary | ICD-10-CM | POA: Diagnosis not present

## 2015-09-30 NOTE — Patient Instructions (Signed)
Good to see you  Overall I think a little better Have some rest days in the week More retraction and extension of the neck  See you soon.

## 2015-09-30 NOTE — Progress Notes (Signed)
  [name] [place], [place] [address] Phone: [phone] Subjective:     CC: Headaches and neck pain follow-up  [id number]  Caitlyn [name] is a 40 y.o. female coming in with complaint of headaches and neck pain. Patient has been followed by neurology previously. Patient was seen recently and did have manipulation. Did do very well for approximate 10 days and then seemed worsening. Patient states that still given tightness. Patient though has not had a headache for approximately 1 week.     Past Medical History:  Diagnosis Date  . Headache    No past surgical history on file. Social History  Substance Use Topics  . Smoking status: Former Research scientist (life sciences)  . Smokeless tobacco: Never Used  . Alcohol use 0.0 oz/week     Comment: social    No Known Allergies Family History  Problem Relation Age of Onset  . Cancer Mother   . Cirrhosis Father   . Cancer Paternal Grandmother     ovarian   . Heart failure Paternal Grandfather      Past medical history, social, surgical and family history all reviewed and no pertinent info pertaining to chief complaint. All other was reviewed using the EMR.    Review of Systems: No visual changes, nausea, vomiting, diarrhea, constipation, dizziness, abdominal pain, skin rash, fevers, chills, night sweats, weight loss, swollen lymph nodes, body aches, joint swelling, muscle aches, chest pain, shortness of breath, mood changes.   Objective  Blood pressure 132/70, pulse 81, weight 136 lb (61.7 kg), SpO2 98 %.  General: No apparent distress alert and oriented x3 mood and affect normal, dressed appropriately.  HEENT: Pupils equal, extraocular movements intact  Respiratory: Patient's speak in full sentences and does not appear short of breath  Cardiovascular: No lower extremity edema, non tender, no erythema  Skin: Warm dry intact with no signs of infection or rash on extremities or on axial skeleton.  Abdomen:  Soft nontender  Neuro: Cranial nerves II through XII are intact, neurovascularly intact in all extremities with 2+ DTRs and 2+ pulses.  Lymph: No lymphadenopathy of posterior or anterior cervical chain or axillae bilaterally.  Gait normal with good balance and coordination.  MSK:  Non tender with full range of motion and good stability and symmetric strength and tone of shoulders, elbows, wrist, hip, knee and ankles bilaterally.  Neck: Inspection unremarkable. No palpable stepoffs. Negative Spurling's maneuver. Significant more tightness than previous exam with limited range of motion with right-sided side bending and left lateral rotation Grip strength and sensation normal in bilateral hands Strength good C4 to T1 distribution No sensory change to C4 to T1 Negative Hoffman sign bilaterally Reflexes normal   OMT Physical Exam   Cervical  C2 flexed rotated and side bent right   Thoracic  T3 extended rotated and side bent right T10 extended rotated and side bent right   Lumbar L2 flexed rotated and side bent left  Sacrum Left on left  Illium Neutral  Same pattern as previously.   Impression and Recommendations:     This case required medical decision making of moderate complexity.

## 2015-09-30 NOTE — Assessment & Plan Note (Signed)
Patient continues to have intermittent headaches. I do think that it is associated more with the neck. Patient is coming continue with home exercises, posture control exercises. We discussed focusing more on the extension and flexion. Patient has an appointment set up in the next 3 weeks.

## 2015-09-30 NOTE — Assessment & Plan Note (Signed)
Decision today to treat with OMT was based on Physical Exam  After verbal consent patient was treated with HVLA, ME techniques in cervical, thoracic, lumbar and sacral areas  Patient tolerated the procedure well with improvement in symptoms  Patient given exercises, stretches and lifestyle modifications  See medications in patient instructions if given  Patient will follow up in 3-4 weeks  

## 2015-10-18 NOTE — Assessment & Plan Note (Signed)
Overall patient seems to be doing relatively well. Patient is more encouraged syndrome previous exam. Patient seems to be making improvement. We will make no significant changes and treatment options at this time and will change patient to 6-12 week intervals.

## 2015-10-18 NOTE — Progress Notes (Signed)
  [name] [place] [place], [place] [address] Phone: [phone] Subjective:     CC: Headaches and neck pain follow-up  [id number]  Caitlyn [name] is a 40 y.o. female coming in with complaint of headaches and neck pain. Patient has been followed by neurology previously. Patient was having exacerbation of neck pain previously. Was seen 3 weeks ago. Was doing very well until a proximal we will 2 days ago. Started having difficulty where she didn't not have full range of motion of her neck when rotating to the right. Patient states that it seemed to be tighter. Patient though has been fortunate enough not to have headaches on a regular basis.     Past Medical History:  Diagnosis Date  . Headache    No past surgical history on file. Social History  Substance Use Topics  . Smoking status: Former Research scientist (life sciences)  . Smokeless tobacco: Never Used  . Alcohol use 0.0 oz/week     Comment: social    No Known Allergies Family History  Problem Relation Age of Onset  . Cancer Mother   . Cirrhosis Father   . Cancer Paternal Grandmother     ovarian   . Heart failure Paternal Grandfather      Past medical history, social, surgical and family history all reviewed and no pertinent info pertaining to chief complaint. All other was reviewed using the EMR.    Review of Systems: No visual changes, nausea, vomiting, diarrhea, constipation, dizziness, abdominal pain, skin rash, fevers, chills, night sweats, weight loss, swollen lymph nodes, body aches, joint swelling, muscle aches, chest pain, shortness of breath, mood changes.   Objective  There were no vitals taken for this visit.  General: No apparent distress alert and oriented x3 mood and affect normal, dressed appropriately.  HEENT: Pupils equal, extraocular movements intact  Respiratory: Patient's speak in full sentences and does not appear short of breath  Cardiovascular: No lower extremity edema, non  tender, no erythema  Skin: Warm dry intact with no signs of infection or rash on extremities or on axial skeleton.  Abdomen: Soft nontender  Neuro: Cranial nerves II through XII are intact, neurovascularly intact in all extremities with 2+ DTRs and 2+ pulses.  Lymph: No lymphadenopathy of posterior or anterior cervical chain or axillae bilaterally.  Gait normal with good balance and coordination.  MSK:  Non tender with full range of motion and good stability and symmetric strength and tone of shoulders, elbows, wrist, hip, knee and ankles bilaterally.  Neck: Inspection unremarkable. No palpable stepoffs. Negative Spurling's maneuver. Continued limitation with right-sided side bending and right-sided rotation. Minimal change from previous exam Grip strength and sensation normal in bilateral hands Strength good C4 to T1 distribution No sensory change to C4 to T1 Negative Hoffman sign bilaterally Reflexes normal   OMT Physical Exam   Cervical  C2 flexed rotated and side bent right   Thoracic  T3 extended rotated and side bent right T7 extended rotated and side bent right   Lumbar L2 flexed rotated and side bent left L4 flexed rotated and side bent right  Sacrum Left on left  Illium Neutral  .   Impression and Recommendations:     This case required medical decision making of moderate complexity.

## 2015-10-19 ENCOUNTER — Encounter: Payer: Self-pay | Admitting: Family Medicine

## 2015-10-19 ENCOUNTER — Ambulatory Visit (INDEPENDENT_AMBULATORY_CARE_PROVIDER_SITE_OTHER): Payer: 59 | Admitting: Family Medicine

## 2015-10-19 VITALS — BP 116/70 | HR 72 | Wt 137.0 lb

## 2015-10-19 DIAGNOSIS — M9908 Segmental and somatic dysfunction of rib cage: Secondary | ICD-10-CM | POA: Diagnosis not present

## 2015-10-19 DIAGNOSIS — R51 Headache: Secondary | ICD-10-CM

## 2015-10-19 DIAGNOSIS — M999 Biomechanical lesion, unspecified: Secondary | ICD-10-CM

## 2015-10-19 DIAGNOSIS — M9902 Segmental and somatic dysfunction of thoracic region: Secondary | ICD-10-CM | POA: Diagnosis not present

## 2015-10-19 DIAGNOSIS — M9901 Segmental and somatic dysfunction of cervical region: Secondary | ICD-10-CM

## 2015-10-19 DIAGNOSIS — G4486 Cervicogenic headache: Secondary | ICD-10-CM

## 2015-10-19 NOTE — Patient Instructions (Signed)
Good to see you  Overall not bad I blame the kid Keep it up  See me again in 4 weeks.

## 2015-10-19 NOTE — Assessment & Plan Note (Signed)
Decision today to treat with OMT was based on Physical Exam  After verbal consent patient was treated with HVLA, ME techniques in cervical, thoracic, lumbar and sacral areas  Patient tolerated the procedure well with improvement in symptoms  Patient given exercises, stretches and lifestyle modifications  See medications in patient instructions if given  Patient will follow up in 3-4 weeks  

## 2015-11-08 ENCOUNTER — Encounter: Payer: Self-pay | Admitting: Family Medicine

## 2015-11-08 MED ORDER — PREDNISONE 50 MG PO TABS: 50.0000 mg | ORAL_TABLET | Freq: Every day | ORAL | 0 refills | Status: DC

## 2015-11-08 MED FILL — predniSONE 50 MG TABS: 50 | 5 days supply | Qty: 5 | Fill #0

## 2015-11-17 NOTE — Progress Notes (Signed)
  [name] [place], [place] [address] Phone: [phone] Subjective:     CC: Headaches and neck pain follow-up  [id number]  Caitlyn [name] is a 40 y.o. female coming in with complaint of headaches and neck pain. Patient has been followed by neurology previously. Patient was having exacerbation of neck pain previously. Continues to have neck discomfort. Not having any headaches which is an improvement. Still feels like she has significant tightness. Wondering if it is something in her diet. Denies any radiation to the hands. Mild increase in stress but nothing severe. Continues to work many hours weekly.     Past Medical History:  Diagnosis Date  . Headache    No past surgical history on file. Social History  Substance Use Topics  . Smoking status: Former Research scientist (life sciences)  . Smokeless tobacco: Never Used  . Alcohol use 0.0 oz/week     Comment: social    No Known Allergies Family History  Problem Relation Age of Onset  . Cancer Mother   . Cirrhosis Father   . Cancer Paternal Grandmother     ovarian   . Heart failure Paternal Grandfather      Past medical history, social, surgical and family history all reviewed and no pertinent info pertaining to chief complaint. All other was reviewed using the EMR.    Review of Systems: No visual changes, nausea, vomiting, diarrhea, constipation, dizziness, abdominal pain, skin rash, fevers, chills, night sweats, weight loss, swollen lymph nodes, body aches, joint swelling, muscle aches, chest pain, shortness of breath, mood changes.   Objective  Blood pressure 124/78, pulse 83, weight 135 lb (61.2 kg), SpO2 98 %.  General: No apparent distress alert and oriented x3 mood and affect normal, dressed appropriately.  HEENT: Pupils equal, extraocular movements intact  Respiratory: Patient's speak in full sentences and does not appear short of breath  Cardiovascular: No lower extremity edema, non tender,  no erythema  Skin: Warm dry intact with no signs of infection or rash on extremities or on axial skeleton.  Abdomen: Soft nontender  Neuro: Cranial nerves II through XII are intact, neurovascularly intact in all extremities with 2+ DTRs and 2+ pulses.  Lymph: No lymphadenopathy of posterior or anterior cervical chain or axillae bilaterally.  Gait normal with good balance and coordination.  MSK:  Non tender with full range of motion and good stability and symmetric strength and tone of shoulders, elbows, wrist, hip, knee and ankles bilaterally.  Neck: Inspection unremarkable. No palpable stepoffs. Negative Spurling's maneuver. Continued limitation with right-sided side bending and right-sided rotation. Grip strength and sensation normal in bilateral hands Strength good C4 to T1 distribution No sensory change to C4 to T1 Negative Hoffman sign bilaterally Reflexes normal Stable from previous exam  OMT Physical Exam  Cervical  C2 flexed rotated and side bent right  C4 flexed rotated and side bent left  Thoracic  T3 extended rotated and side bent right T6 extended rotated and side bent right   Lumbar L2 flexed rotated and side bent left L4 flexed rotated and side bent right  Sacrum Left on left   .   Impression and Recommendations:     This case required medical decision making of moderate complexity.

## 2015-11-17 NOTE — Assessment & Plan Note (Addendum)
No significant change from previous exam.. We discussed decreasing frequency to 6 week intervals. We discussed working on home exercises and icing. We discussed also diet changes. Discussed the possibility of medications which patient declined Patient will continue with the other topics. Patient will come back and see me again in 6 weeks.

## 2015-11-17 NOTE — Assessment & Plan Note (Signed)
Decision today to treat with OMT was based on Physical Exam  After verbal consent patient was treated with HVLA, ME techniques in cervical, thoracic, lumbar and sacral areas  Patient tolerated the procedure well with improvement in symptoms  Patient given exercises, stretches and lifestyle modifications  See medications in patient instructions if given  Patient will follow up in 6 weeks      

## 2015-11-18 ENCOUNTER — Encounter: Payer: Self-pay | Admitting: Family Medicine

## 2015-11-18 ENCOUNTER — Ambulatory Visit (INDEPENDENT_AMBULATORY_CARE_PROVIDER_SITE_OTHER): Payer: 59 | Admitting: Family Medicine

## 2015-11-18 VITALS — BP 124/78 | HR 83 | Wt 135.0 lb

## 2015-11-18 DIAGNOSIS — M999 Biomechanical lesion, unspecified: Secondary | ICD-10-CM | POA: Diagnosis not present

## 2015-11-18 DIAGNOSIS — R51 Headache: Secondary | ICD-10-CM

## 2015-11-18 DIAGNOSIS — G4486 Cervicogenic headache: Secondary | ICD-10-CM

## 2015-11-18 NOTE — Patient Instructions (Signed)
great to see you  I know it is frustrating  Ice is your friend Keep doing what you are doing Try the diet changes in stepwise fashion See me again in 4-6 weeks or send me a message if you would like to try effexor

## 2015-12-02 ENCOUNTER — Other Ambulatory Visit: Payer: Self-pay

## 2015-12-02 DIAGNOSIS — D239 Other benign neoplasm of skin, unspecified: Secondary | ICD-10-CM | POA: Diagnosis not present

## 2015-12-02 DIAGNOSIS — D485 Neoplasm of uncertain behavior of skin: Secondary | ICD-10-CM | POA: Diagnosis not present

## 2015-12-23 ENCOUNTER — Ambulatory Visit: Payer: 59 | Admitting: Family Medicine

## 2016-01-04 ENCOUNTER — Encounter: Payer: Self-pay | Admitting: Family Medicine

## 2016-01-11 ENCOUNTER — Ambulatory Visit: Payer: 59 | Admitting: Family Medicine

## 2016-02-10 ENCOUNTER — Ambulatory Visit: Payer: 59 | Admitting: Family Medicine

## 2016-02-25 ENCOUNTER — Encounter: Payer: Self-pay | Admitting: Family Medicine

## 2016-02-25 MED ORDER — VENLAFAXINE HCL ER 37.5 MG PO CP24: 37.5000 mg | ORAL_CAPSULE | Freq: Every day | ORAL | 1 refills | Status: DC

## 2016-02-25 MED FILL — VENLAFAXINE HCL ER 37.5 MG: 37.5 | 30 days supply | Qty: 30 | Fill #0

## 2016-03-02 NOTE — Progress Notes (Signed)
  [name] [place], [place] [address] Phone: [phone] Subjective:     CC: Headaches and neck pain follow-up  [id number]  Caitlyn [name] is a 41 y.o. female coming in with complaint of headaches and neck pain. Patient has been followed by neurology previously. Patient was having exacerbation of neck pain previously. Continues to have neck discomfort. Overall though effexor is doing well. States helped with headaches, feeling better. Still mild tightness.      Past Medical History:  Diagnosis Date  . Headache    No past surgical history on file. Social History  Substance Use Topics  . Smoking status: Former Research scientist (life sciences)  . Smokeless tobacco: Never Used  . Alcohol use 0.0 oz/week     Comment: social    No Known Allergies Family History  Problem Relation Age of Onset  . Cancer Mother   . Cirrhosis Father   . Cancer Paternal Grandmother     ovarian   . Heart failure Paternal Grandfather      Past medical history, social, surgical and family history all reviewed and no pertinent info pertaining to chief complaint. All other was reviewed using the EMR.    Review of Systems: No visual changes, nausea, vomiting, diarrhea, constipation, dizziness, abdominal pain, skin rash, fevers, chills, night sweats, weight loss, swollen lymph nodes, body aches, joint swelling, muscle aches, chest pain, shortness of breath, mood changes.    Objective  There were no vitals taken for this visit.  Systems examined below as of 03/03/16 General: NAD A&O x3 mood, affect normal  HEENT: Pupils equal, extraocular movements intact no nystagmus Respiratory: not short of breath at rest or with speaking Cardiovascular: No lower extremity edema, non tender Skin: Warm dry intact with no signs of infection or rash on extremities or on axial skeleton. Abdomen: Soft nontender, no masses Neuro: Cranial nerves  intact, neurovascularly intact in all extremities  with 2+ DTRs and 2+ pulses. Lymph: No lymphadenopathy appreciated today  Gait normal with good balance and coordination.  MSK: Non tender with full range of motion and good stability and symmetric strength and tone of shoulders, elbows, wrist,  knee hips and ankles bilaterally.   Neck: Inspection unremarkable. No palpable stepoffs. Negative Spurling's maneuver. Mild limitation in internal range of motion to the right. Grip strength and sensation normal in bilateral hands Strength good C4 to T1 distribution No sensory change to C4 to T1 Negative Hoffman sign bilaterally Reflexes normal  Osteopathic findings Cervical C2 flexed rotated and side bent right C4 flexed rotated and side bent left C6 flexed rotated and side bent left T3 extended rotated and side bent right inhaled third rib T7 extended rotated and side bent left L2 flexed rotated and side bent right Sacrum right on right'    .   Impression and Recommendations:     This case required medical decision making of moderate complexity.

## 2016-03-03 ENCOUNTER — Ambulatory Visit (INDEPENDENT_AMBULATORY_CARE_PROVIDER_SITE_OTHER): Payer: 59 | Admitting: Family Medicine

## 2016-03-03 ENCOUNTER — Encounter: Payer: Self-pay | Admitting: Family Medicine

## 2016-03-03 VITALS — BP 138/82 | HR 78 | Ht 63.0 in | Wt 138.0 lb

## 2016-03-03 DIAGNOSIS — M999 Biomechanical lesion, unspecified: Secondary | ICD-10-CM

## 2016-03-03 DIAGNOSIS — G4486 Cervicogenic headache: Secondary | ICD-10-CM

## 2016-03-03 DIAGNOSIS — R51 Headache: Secondary | ICD-10-CM

## 2016-03-03 NOTE — Patient Instructions (Signed)
You are awesome.  Ice when you need it You know the drill  Send a message in a month See me again in 6-12 weeks!

## 2016-03-03 NOTE — Assessment & Plan Note (Signed)
Patient is doing somewhat better. Discussed ergonomics and home exercises. We discussed which activities to do a which ones to avoid. Patient will be active otherwise. Patient will see me in 6-12 weeks. Continue the Effexor

## 2016-03-03 NOTE — Assessment & Plan Note (Signed)
Decision today to treat with OMT was based on Physical Exam  After verbal consent patient was treated with HVLA, ME, FPR techniques in cervical, thoracic, lumbar and sacral areas  Patient tolerated the procedure well with improvement in symptoms  Patient given exercises, stretches and lifestyle modifications  See medications in patient instructions if given  Patient will follow up in 6-12 weeks 

## 2016-03-21 MED FILL — TRIAMCINOLONE 0.1% CREAM: 0.1 | 30 days supply | Qty: 50 | Fill #1

## 2016-03-21 MED FILL — VENLAFAXINE HCL ER 37.5 MG: 37.5 | 30 days supply | Qty: 30 | Fill #1

## 2016-04-16 ENCOUNTER — Encounter: Payer: Self-pay | Admitting: Family Medicine

## 2016-04-17 ENCOUNTER — Other Ambulatory Visit: Payer: Self-pay

## 2016-04-17 MED ORDER — VENLAFAXINE HCL ER 37.5 MG PO CP24: 37.5000 mg | ORAL_CAPSULE | Freq: Every day | ORAL | 1 refills | Status: DC

## 2016-04-17 MED FILL — VENLAFAXINE HCL ER 37.5 MG: 37.5 | 30 days supply | Qty: 30 | Fill #0

## 2016-04-18 ENCOUNTER — Ambulatory Visit: Payer: 59 | Admitting: Family Medicine

## 2016-04-25 ENCOUNTER — Ambulatory Visit: Payer: 59 | Admitting: Family Medicine

## 2016-04-26 ENCOUNTER — Encounter: Payer: Self-pay | Admitting: Family Medicine

## 2016-04-26 ENCOUNTER — Ambulatory Visit (INDEPENDENT_AMBULATORY_CARE_PROVIDER_SITE_OTHER): Payer: 59 | Admitting: Family Medicine

## 2016-04-26 VITALS — BP 124/82 | HR 90 | Ht 63.0 in | Wt 133.4 lb

## 2016-04-26 DIAGNOSIS — M999 Biomechanical lesion, unspecified: Secondary | ICD-10-CM | POA: Diagnosis not present

## 2016-04-26 DIAGNOSIS — G4486 Cervicogenic headache: Secondary | ICD-10-CM

## 2016-04-26 DIAGNOSIS — R51 Headache: Secondary | ICD-10-CM | POA: Diagnosis not present

## 2016-04-26 NOTE — Assessment & Plan Note (Signed)
Decision today to treat with OMT was based on Physical Exam  After verbal consent patient was treated with HVLA, ME, FPR techniques in cervical, thoracic, rib areas  Patient tolerated the procedure well with improvement in symptoms  Patient given exercises, stretches and lifestyle modifications  See medications in patient instructions if given  Patient will follow up in 2-4 weeks

## 2016-04-26 NOTE — Progress Notes (Signed)
  [name] [address] [place], [place] [address] Phone: [phone] Subjective:     CC: Headaches and neck pain follow-up  [id number]  Caitlyn [name] is a 41 y.o. female coming in with complaint of headaches and neck pain. Asian has been doing very good for quite some time. Started having some mild increase in tenderness again. Patient is leaving town and does not want have any worsening symptoms. Denies any radiation down the arm. Any true headaches. Some mild neck stiffness overall. Patient feels that the Effexor has been very beneficial. Not having as much pain when it does occur not as severe.    Past Medical History:  Diagnosis Date  . Headache    No past surgical history on file. Social History  Substance Use Topics  . Smoking status: Former Research scientist (life sciences)  . Smokeless tobacco: Never Used  . Alcohol use 0.0 oz/week     Comment: social    No Known Allergies Family History  Problem Relation Age of Onset  . Cancer Mother   . Cirrhosis Father   . Cancer Paternal Grandmother     ovarian   . Heart failure Paternal Grandfather      Past medical history, social, surgical and family history all reviewed and no pertinent info pertaining to chief complaint. All other was reviewed using the EMR.    Review of Systems: No visual changes, nausea, vomiting, diarrhea, constipation, dizziness, abdominal pain, skin rash, fevers, chills, night sweats, weight loss, swollen lymph nodes, body aches, joint swelling, muscle aches, chest pain, shortness of breath, mood changes.    Objective  Blood pressure 124/82, pulse 90, height 5\' 3"  (1.6 m), weight 133 lb 6.4 oz (60.5 kg), SpO2 100 %.  Systems examined below as of 04/26/16 General: NAD A&O x3 mood, affect normal  HEENT: Pupils equal, extraocular movements intact no nystagmus Respiratory: not short of breath at rest or with speaking Cardiovascular: No lower extremity edema, non tender Skin: Warm dry intact  with no signs of infection or rash on extremities or on axial skeleton. Abdomen: Soft nontender, no masses Neuro: Cranial nerves  intact, neurovascularly intact in all extremities with 2+ DTRs and 2+ pulses. Lymph: No lymphadenopathy appreciated today  Gait normal with good balance and coordination.  MSK: Non tender with full range of motion and good stability and symmetric strength and tone of shoulders, elbows, wrist,  knee hips and ankles bilaterally.    Neck: Inspection unremarkable. No palpable stepoffs. Negative Spurling's maneuver. Full neck range of motion Grip strength and sensation normal in bilateral hands Strength good C4 to T1 distribution No sensory change to C4 to T1 Negative Hoffman sign bilaterally Reflexes normal  Osteopathic findings Cervical C2 flexed rotated and side bent right C4 flexed rotated and side bent left C7 flexed rotated and side bent left T3 extended rotated and side bent right inhaled third rib T9 extended rotated and side bent left L2 flexed rotated and side bent right Sacrum right on right     .   Impression and Recommendations:     This case required medical decision making of moderate complexity.

## 2016-04-26 NOTE — Assessment & Plan Note (Signed)
Patient is doing relatively well. Does respond well to osteopathic manipulation. Continue the medication of the low dose but we'll consider increasing. She'll follow-up again in 2 weeks patient's travel causes some exacerbation otherwise patient will follow-up with me as needed.

## 2016-04-26 NOTE — Patient Instructions (Signed)
God to see you  Travel safe you will do great  Keep me updated on the medicine See em again maybe in 2 weeks just in case for the bad bed ;)

## 2016-04-28 DIAGNOSIS — G44229 Chronic tension-type headache, not intractable: Secondary | ICD-10-CM | POA: Diagnosis not present

## 2016-04-28 DIAGNOSIS — Z0000 Encounter for general adult medical examination without abnormal findings: Secondary | ICD-10-CM | POA: Diagnosis not present

## 2016-04-28 DIAGNOSIS — E785 Hyperlipidemia, unspecified: Secondary | ICD-10-CM | POA: Diagnosis not present

## 2016-04-28 DIAGNOSIS — Z131 Encounter for screening for diabetes mellitus: Secondary | ICD-10-CM | POA: Diagnosis not present

## 2016-04-28 DIAGNOSIS — M542 Cervicalgia: Secondary | ICD-10-CM | POA: Diagnosis not present

## 2016-04-28 DIAGNOSIS — Z23 Encounter for immunization: Secondary | ICD-10-CM | POA: Diagnosis not present

## 2016-05-02 ENCOUNTER — Ambulatory Visit: Payer: 59 | Admitting: Family Medicine

## 2016-05-16 MED FILL — VENLAFAXINE HCL ER 37.5 MG: 37.5 | 30 days supply | Qty: 30 | Fill #1

## 2016-06-13 ENCOUNTER — Encounter: Payer: Self-pay | Admitting: Family Medicine

## 2016-06-13 MED ORDER — VENLAFAXINE HCL ER 37.5 MG PO CP24: 37.5000 mg | ORAL_CAPSULE | Freq: Every day | ORAL | 1 refills | Status: DC

## 2016-06-13 MED FILL — VENLAFAXINE HCL ER 37.5 MG: 37.5 | 30 days supply | Qty: 30 | Fill #0

## 2016-07-18 MED FILL — VENLAFAXINE HCL ER 37.5 MG: 37.5 | 30 days supply | Qty: 30 | Fill #1

## 2016-07-21 ENCOUNTER — Ambulatory Visit (INDEPENDENT_AMBULATORY_CARE_PROVIDER_SITE_OTHER): Payer: 59 | Admitting: Family Medicine

## 2016-07-21 ENCOUNTER — Encounter: Payer: Self-pay | Admitting: Family Medicine

## 2016-07-21 VITALS — BP 126/78 | HR 84 | Ht 63.0 in | Wt 134.0 lb

## 2016-07-21 DIAGNOSIS — G4486 Cervicogenic headache: Secondary | ICD-10-CM

## 2016-07-21 DIAGNOSIS — R51 Headache: Secondary | ICD-10-CM

## 2016-07-21 DIAGNOSIS — M999 Biomechanical lesion, unspecified: Secondary | ICD-10-CM

## 2016-07-21 NOTE — Assessment & Plan Note (Signed)
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 We discussed with patient at great length and has done very well. No change of medication this point. Patient has done well with manipulation. Encourage ergonomic skin. Follow-up again in 3 months

## 2016-07-21 NOTE — Patient Instructions (Signed)
Saunders Glance are awesome Stay active as always See me again in 3 months!

## 2016-07-21 NOTE — Progress Notes (Signed)
  [name] [address] [place], [place] [address] Phone: [phone] Subjective:     CC: Headaches and neck pain follow-up  [id number]  Caitlyn [name] is a 41 y.o. female coming in with complaint of headaches and neck pain. Patient has been doing very well. Very mild headaches. Patient has not been seen for 3 months and states that it only now has started having a little discomfort. Patient feels that the Effexor has helped out significantly with her headaches as well as her mood. Happy with the results of far. Over the course last 24-hour some increasing discomfort and pain. Concerned and will cause another headache. Here for manipulation.    Past Medical History:  Diagnosis Date  . Headache    No past surgical history on file. Social History  Substance Use Topics  . Smoking status: Former Research scientist (life sciences)  . Smokeless tobacco: Never Used  . Alcohol use 0.0 oz/week     Comment: social    No Known Allergies Family History  Problem Relation Age of Onset  . Cancer Mother   . Cirrhosis Father   . Cancer Paternal Grandmother        ovarian   . Heart failure Paternal Grandfather      Past medical history, social, surgical and family history all reviewed and no pertinent info pertaining to chief complaint. All other was reviewed using the EMR.    Review of Systems: No  visual changes, nausea, vomiting, diarrhea, constipation, dizziness, abdominal pain, skin rash, fevers, chills, night sweats, weight loss, swollen lymph nodes, body aches, joint swelling, muscle aches, chest pain, shortness of breath, mood changes.  Positive mild headache   Objective  Blood pressure 126/78, pulse 84, height 5\' 3"  (1.6 m), weight 134 lb (60.8 kg), SpO2 98 %.  Systems examined below as of 07/21/16 General: NAD A&O x3 mood, affect normal  HEENT: Pupils equal, extraocular movements intact no nystagmus Respiratory: not short of breath at rest or with  speaking Cardiovascular: No lower extremity edema, non tender Skin: Warm dry intact with no signs of infection or rash on extremities or on axial skeleton. Abdomen: Soft nontender, no masses Neuro: Cranial nerves  intact, neurovascularly intact in all extremities with 2+ DTRs and 2+ pulses. Lymph: No lymphadenopathy appreciated today  Gait normal with good balance and coordination.  MSK: Non tender with full range of motion and good stability and symmetric strength and tone of shoulders, elbows, wrist,  knee hips and ankles bilaterally.   Neck: Inspection unremarkable. No palpable stepoffs. Negative Spurling's maneuver. Full neck range of motion Grip strength and sensation normal in bilateral hands Strength good C4 to T1 distribution No sensory change to C4 to T1 Negative Hoffman sign bilaterally Reflexes normal  Osteopathic findings C2 flexed rotated and side bent right C4 flexed rotated and side bent left C6 flexed rotated and side bent left T3 extended rotated and side bent right inhaled third rib T9 extended rotated and side bent left L2 flexed rotated and side bent right Sacrum right on right      .   Impression and Recommendations:     This case required medical decision making of moderate complexity.

## 2016-07-21 NOTE — Assessment & Plan Note (Signed)
Decision today to treat with OMT was based on Physical Exam  After verbal consent patient was treated with HVLA, ME, FPR techniques in cervical, thoracic, rib lumbar and sacral areas  Patient tolerated the procedure well with improvement in symptoms  Patient given exercises, stretches and lifestyle modifications  See medications in patient instructions if given  Patient will follow up in 12 weeks 

## 2016-08-04 DIAGNOSIS — Z01419 Encounter for gynecological examination (general) (routine) without abnormal findings: Secondary | ICD-10-CM | POA: Diagnosis not present

## 2016-08-04 DIAGNOSIS — Z6823 Body mass index (BMI) 23.0-23.9, adult: Secondary | ICD-10-CM | POA: Diagnosis not present

## 2016-08-13 IMAGING — MG MM DIAGNOSTIC BILATERAL
4 series · 4 of 4 positions shown · non-contrast
Comparison: Prior exams

CLINICAL DATA: Diffuse thickening right upper outer quadrant for 1
month, no focal mass

EXAM:
DIGITAL DIAGNOSTIC bilateral MAMMOGRAM WITH CAD

[R CC]
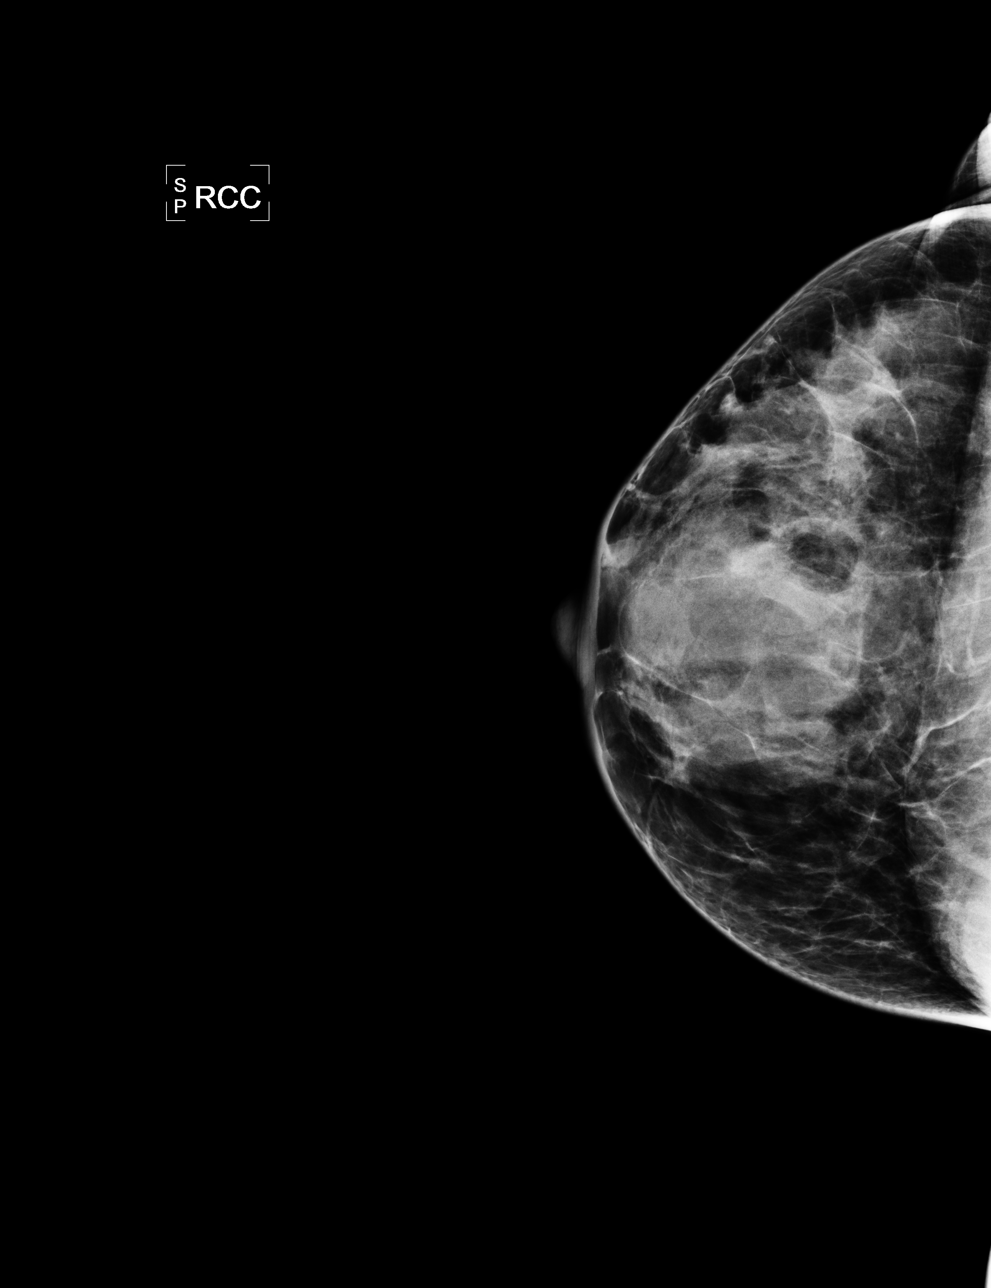

[L CC]
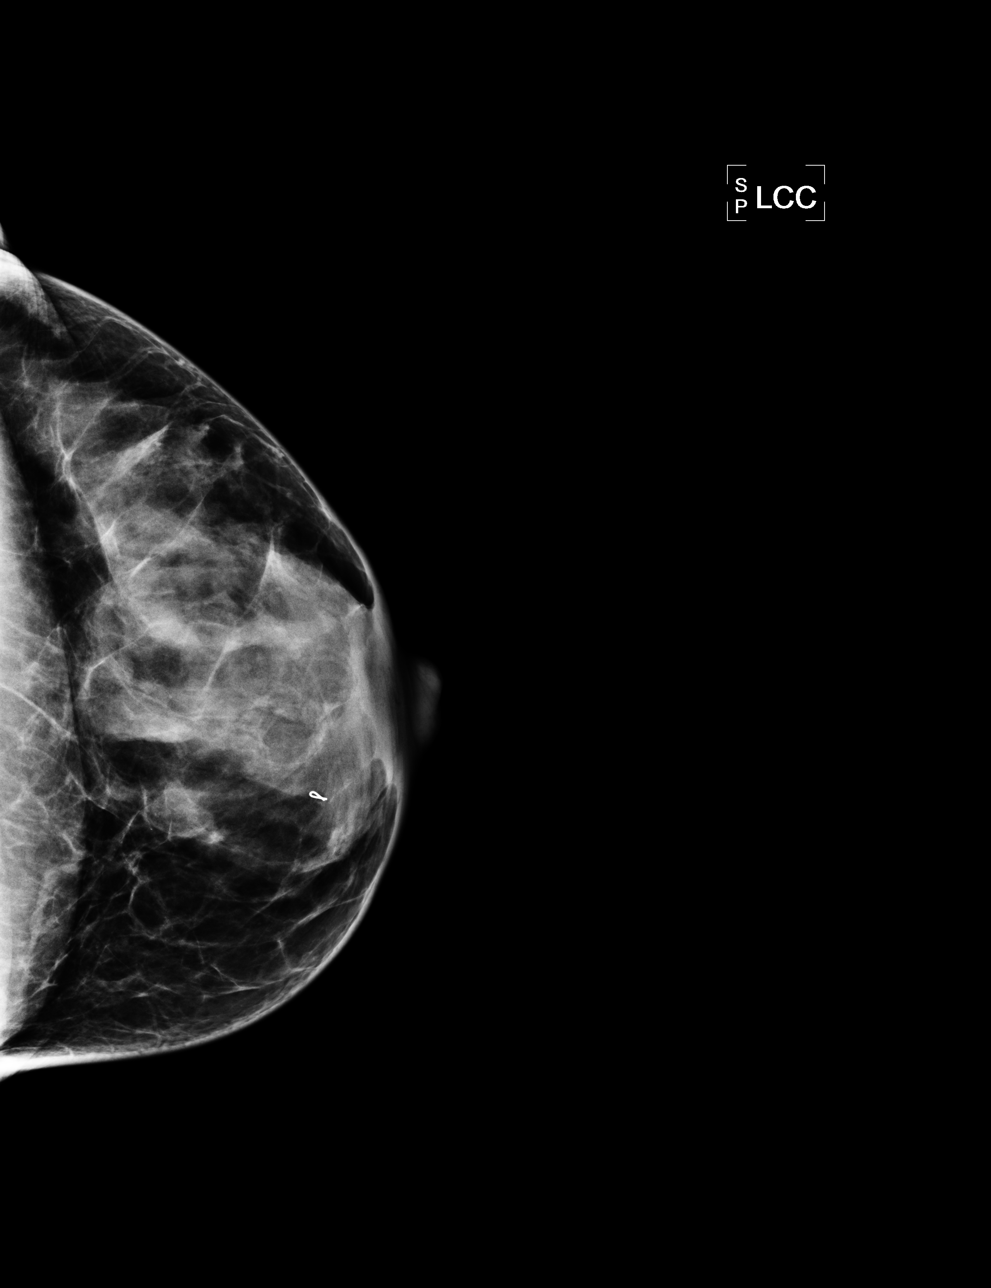

[L MLO]
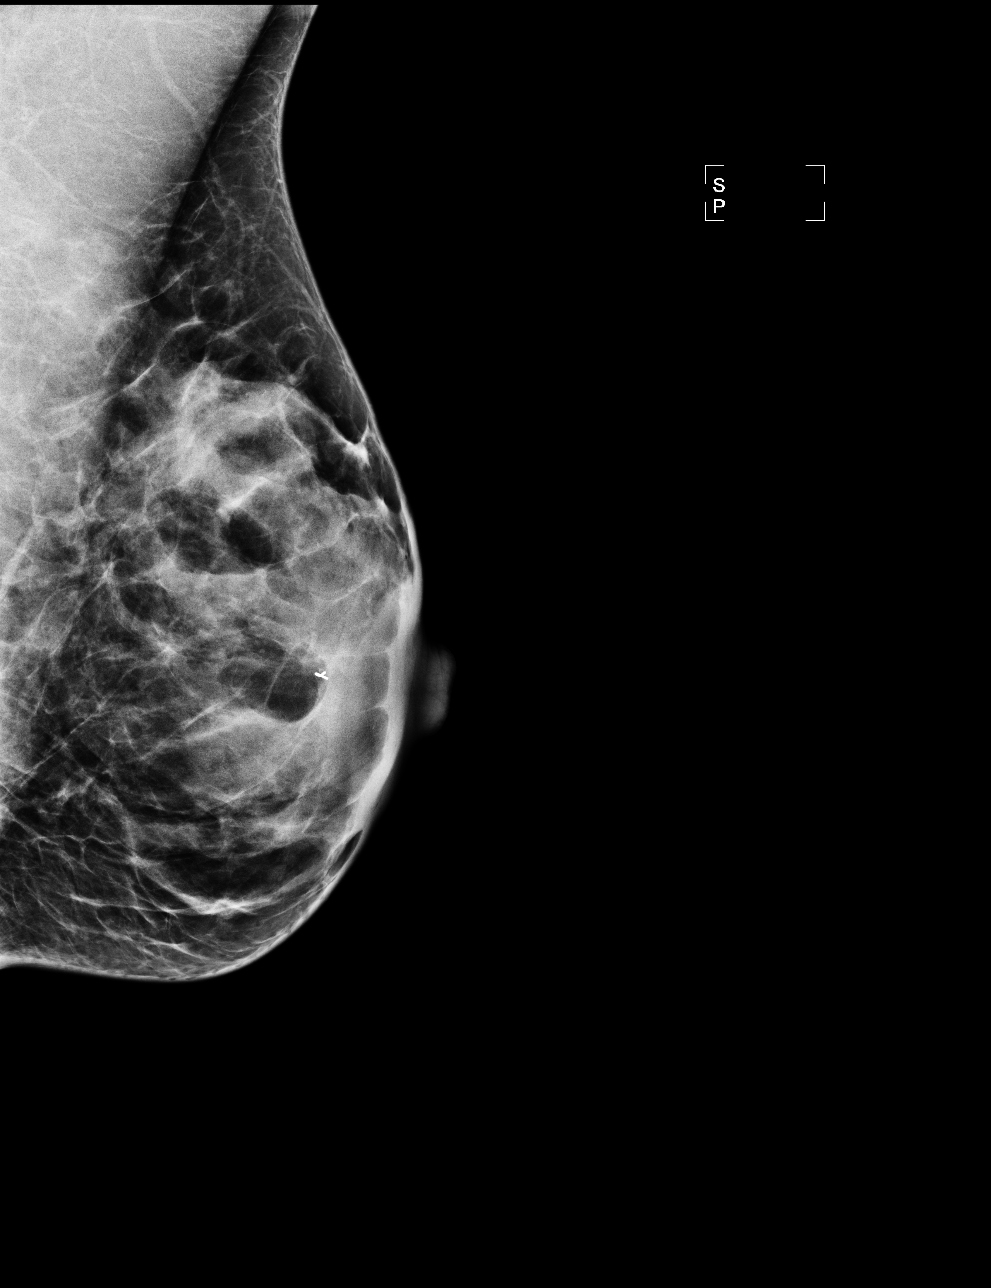

[R MLO]
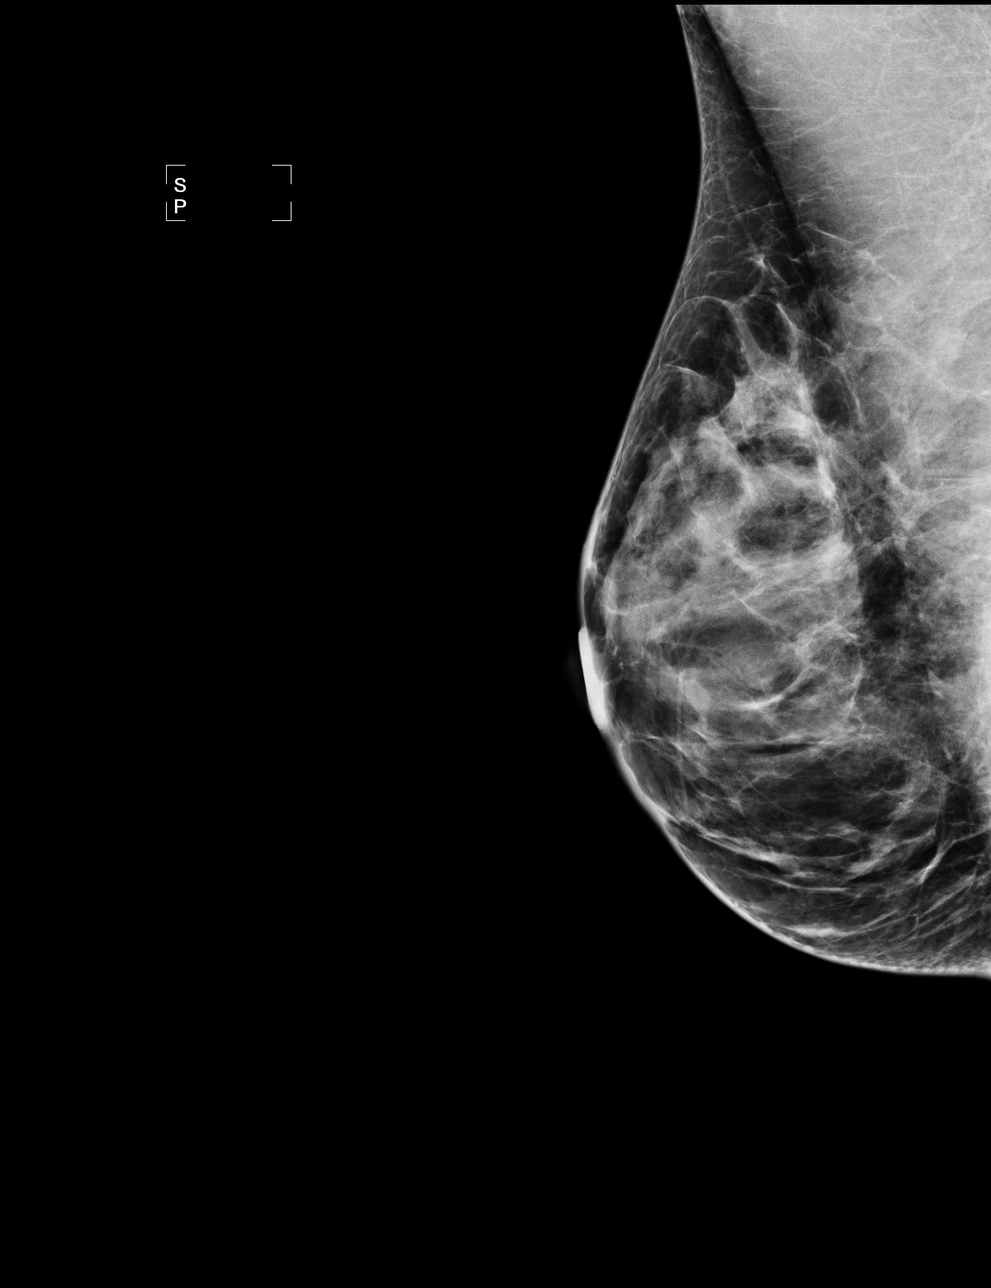

[4 of 4 positions shown; findings below may reference images not displayed]

ACR Breast Density Category d: The breast tissue is extremely dense,
which lowers the sensitivity of mammography.
FINDINGS: No evidence for malignancy is identified in either breast. Ribbon
shaped clip is reidentified in the central left breast.

Mammographic images were processed with CAD.
IMPRESSION: No evidence for malignancy. Any further management of the questioned
palpable finding should be dictated by clinical assessment.

RECOMMENDATION:
Screening mammogram at age 42 unless there are persistent or
intervening clinical concerns. (Code:W2-Y-722)

I have discussed the findings and recommendations with the patient.
Results were also provided in writing at the conclusion of the
visit. If applicable, a reminder letter will be sent to the patient
regarding the next appointment.

BI-RADS CATEGORY  1: Negative.

## 2016-08-15 ENCOUNTER — Encounter: Payer: Self-pay | Admitting: Family Medicine

## 2016-08-16 ENCOUNTER — Other Ambulatory Visit: Payer: Self-pay | Admitting: *Deleted

## 2016-08-16 MED ORDER — VENLAFAXINE HCL ER 37.5 MG PO CP24: 37.5000 mg | ORAL_CAPSULE | Freq: Every day | ORAL | 1 refills | Status: DC

## 2016-08-16 MED FILL — VENLAFAXINE HCL ER 37.5 MG: 37.5 | 90 days supply | Qty: 90 | Fill #0

## 2016-10-03 ENCOUNTER — Ambulatory Visit (INDEPENDENT_AMBULATORY_CARE_PROVIDER_SITE_OTHER): Payer: 59 | Admitting: Family Medicine

## 2016-10-03 ENCOUNTER — Encounter: Payer: Self-pay | Admitting: Family Medicine

## 2016-10-03 VITALS — BP 126/80 | HR 93 | Wt 137.5 lb

## 2016-10-03 DIAGNOSIS — R51 Headache: Secondary | ICD-10-CM | POA: Diagnosis not present

## 2016-10-03 DIAGNOSIS — M999 Biomechanical lesion, unspecified: Secondary | ICD-10-CM | POA: Diagnosis not present

## 2016-10-03 DIAGNOSIS — G4486 Cervicogenic headache: Secondary | ICD-10-CM

## 2016-10-03 NOTE — Assessment & Plan Note (Signed)
Stable overall. Seems to do better and 8 week intervals. We discussed with patient continuing the same exercises and the same medications. We discussed we can increase if needed. Follow-up again in 8 weeks.

## 2016-10-03 NOTE — Patient Instructions (Signed)
Good to see you  Overall not bad Lets not change anything big Ice is your friend.  See me again in 8 weeks [employer] "total support" online would be great

## 2016-10-03 NOTE — Assessment & Plan Note (Signed)
Decision today to treat with OMT was based on Physical Exam  After verbal consent patient was treated with HVLA, ME, FPR techniques in cervical, thoracic, rib areas  Patient tolerated the procedure well with improvement in symptoms  Patient given exercises, stretches and lifestyle modifications  See medications in patient instructions if given  Patient will follow up in 8 weeks 

## 2016-10-03 NOTE — Progress Notes (Signed)
  [name] [address] [place], [place] [address] Phone: [phone] Subjective:    CC: Headaches and neck pain follow-up  [id number]  KARIELLE DAVIDOW is a 41 y.o. female coming in with complaint of headaches and neck pain. Patient has been doing relatively well with me. Patient has noticed some increasing tightness recently. Describes the pain as a dull, throbbing aching pain. Nothing severe just worsening of previous symptoms. Continues to take the Effexor with no side effects.    Past Medical History:  Diagnosis Date  . Headache    No past surgical history on file. Social History  Substance Use Topics  . Smoking status: Former Research scientist (life sciences)  . Smokeless tobacco: Never Used  . Alcohol use 0.0 oz/week     Comment: social    No Known Allergies Family History  Problem Relation Age of Onset  . Cancer Mother   . Cirrhosis Father   . Cancer Paternal Grandmother        ovarian   . Heart failure Paternal Grandfather      Past medical history, social, surgical and family history all reviewed and no pertinent info pertaining to chief complaint. All other was reviewed using the EMR.    Review of Systems: No  visual changes, nausea, vomiting, diarrhea, constipation, dizziness, abdominal pain, skin rash, fevers, chills, night sweats, weight loss, swollen lymph nodes, body aches, joint swelling, muscle aches, chest pain, shortness of breath, mood changes. Mild positive headaches   Objective  Blood pressure 126/80, pulse 93, weight 137 lb 8 oz (62.4 kg), SpO2 99 %.  Systems examined below as of 10/03/16 General: NAD A&O x3 mood, affect normal  HEENT: Pupils equal, extraocular movements intact no nystagmus Respiratory: not short of breath at rest or with speaking Cardiovascular: No lower extremity edema, non tender Skin: Warm dry intact with no signs of infection or rash on extremities or on axial skeleton. Abdomen: Soft nontender, no masses Neuro:  Cranial nerves  intact, neurovascularly intact in all extremities with 2+ DTRs and 2+ pulses. Lymph: No lymphadenopathy appreciated today  Gait normal with good balance and coordination.  MSK: Non tender with full range of motion and good stability and symmetric strength and tone of shoulders, elbows, wrist,  knee hips and ankles bilaterally.   Neck: Inspection unremarkable. No palpable stepoffs. Negative Spurling's maneuver. Mild tightness noted on the right side rotation and left-sided side bending Grip strength and sensation normal in bilateral hands Strength good C4 to T1 distribution No sensory change to C4 to T1 Negative Hoffman sign bilaterally Reflexes normal  Osteopathic findings C2 flexed rotated and side bent right C4 flexed rotated and side bent left C7 flexed rotated and side bent right T3 extended rotated and side bent right inhaled third rib T9 extended rotated and side bent left L2 flexed rotated and side bent right       .   Impression and Recommendations:     This case required medical decision making of moderate complexity.

## 2016-11-08 MED FILL — VENLAFAXINE HCL ER 37.5 MG: 37.5 | 90 days supply | Qty: 90 | Fill #1

## 2016-11-30 ENCOUNTER — Encounter: Payer: Self-pay | Admitting: Family Medicine

## 2016-11-30 ENCOUNTER — Ambulatory Visit (INDEPENDENT_AMBULATORY_CARE_PROVIDER_SITE_OTHER): Payer: 59 | Admitting: Family Medicine

## 2016-11-30 VITALS — BP 130/70 | HR 93 | Ht 63.0 in | Wt 139.0 lb

## 2016-11-30 DIAGNOSIS — R51 Headache: Secondary | ICD-10-CM | POA: Diagnosis not present

## 2016-11-30 DIAGNOSIS — G4486 Cervicogenic headache: Secondary | ICD-10-CM

## 2016-11-30 DIAGNOSIS — M999 Biomechanical lesion, unspecified: Secondary | ICD-10-CM | POA: Diagnosis not present

## 2016-11-30 NOTE — Assessment & Plan Note (Signed)
Patient has been doing fairly well with his intended 12 week intervals. Continue the Effexor at the same dose. We discussed icing regimen and home exercises. We discussed which activities to do in which ones to avoid. Patient will try couple over-the-counter medications. Follow-up again in 10-12 weeks

## 2016-11-30 NOTE — Patient Instructions (Signed)
Good to see you as always Have a good trip  DHEA 50 mg daily for 4 weeks then off 2 weeks Prilosec 2 times a day for 2 weeks.  Keep up what you are doing  See me again in 10-12 weeks

## 2016-11-30 NOTE — Progress Notes (Signed)
[name] [address] [place], [place] [address] Phone: [phone] Subjective:     CC: Headache and neck pain follow-up  [id number]  Caitlyn [name] is a 41 y.o. female coming in with complaint of headache and pain follow-up. Patient suite for quite some time. Was doing very well with medication as well as Effexor. Patient states that she has one today and went that day. Patient denies any radiation of pain. Not affecting daily activities. Patient rates the severity of pain a 5 out of 10      Past Medical History:  Diagnosis Date  . Headache    No past surgical history on file. Social History   Social History  . Marital status: Married    Spouse name: N/A  . Number of children: N/A  . Years of education: N/A   Social History Main Topics  . Smoking status: Former Research scientist (life sciences)  . Smokeless tobacco: Never Used  . Alcohol use 0.0 oz/week     Comment: social   . Drug use: No  . Sexual activity: Yes    Partners: Male   Other Topics Concern  . None   Social History Narrative  . None   No Known Allergies Family History  Problem Relation Age of Onset  . Cancer Mother   . Cirrhosis Father   . Cancer Paternal Grandmother        ovarian   . Heart failure Paternal Grandfather      Past medical history, social, surgical and family history all reviewed in electronic medical record.  No pertanent information unless stated regarding to the chief complaint.   Review of Systems:Review of systems updated and as accurate as of 11/30/16  No headache, visual changes, nausea, vomiting, diarrhea, constipation, dizziness, abdominal pain, skin rash, fevers, chills, night sweats, weight loss, swollen lymph nodes, body aches, joint swelling, chest pain, shortness of breath, mood changes. Positive muscle aches  Objective  Blood pressure 130/70, pulse 93, height 5\' 3"  (1.6 m), weight 139 lb (63 kg), SpO2 98 %. Systems examined below as of 11/30/16     General: No apparent distress alert and oriented x3 mood and affect normal, dressed appropriately.  HEENT: Pupils equal, extraocular movements intact  Respiratory: Patient's speak in full sentences and does not appear short of breath  Cardiovascular: No lower extremity edema, non tender, no erythema  Skin: Warm dry intact with no signs of infection or rash on extremities or on axial skeleton.  Abdomen: Soft nontender  Neuro: Cranial nerves II through XII are intact, neurovascularly intact in all extremities with 2+ DTRs and 2+ pulses.  Lymph: No lymphadenopathy of posterior or anterior cervical chain or axillae bilaterally.  Gait normal with good balance and coordination.  MSK:  Non tender with full range of motion and good stability and symmetric strength and tone of shoulders, elbows, wrist, hip, knee and ankles bilaterally.  Neck: Inspection mild loss in lordosis. No palpable stepoffs. Negative Spurling's maneuver. Mild limitation with rotation and side bending bilaterally and 5-10. Patient did have full range of motion after manipulation. Grip strength and sensation normal in bilateral hands Strength good C4 to T1 distribution No sensory change to C4 to T1 Negative Hoffman sign bilaterally Reflexes normal  Osteopathic findings  C2 flexed rotated and side bent right C4 flexed rotated and side bent left C7 flexed rotated and side bent left T3 extended rotated and side bent right inhaled third rib T11 extended rotated and side bent left  Impression and Recommendations:     This case required medical decision making of moderate complexity.      Note: This dictation was prepared with Dragon dictation along with smaller phrase technology. Any transcriptional errors that result from this process are unintentional.

## 2016-11-30 NOTE — Assessment & Plan Note (Signed)
Decision today to treat with OMT was based on Physical Exam  After verbal consent patient was treated with HVLA, ME, FPR techniques in cervical, thoracic, rib areas  Patient tolerated the procedure well with improvement in symptoms  Patient given exercises, stretches and lifestyle modifications  See medications in patient instructions if given  Patient will follow up in 10-12 weeks

## 2017-02-12 NOTE — Progress Notes (Signed)
[name] [address] [place], [place] [address] Phone: [phone] Subjective:    CC: Neck pain follow-up  [id number]  Caitlyn [name] is a 42 y.o. female coming in with complaint of neck pain. 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 Has responded very well to osteopathic manipulation. Patient was doing very well with the Effexor but is starting have some increasing discomfort and pain. Seems that the headaches are becoming a little more frequent as well. Some increasing stress as well. Patient is trying certain things like meditation with mixed results.      Past Medical History:  Diagnosis Date  . Headache    No past surgical history on file. Social History   Socioeconomic History  . Marital status: Married    Spouse name: None  . Number of children: None  . Years of education: None  . Highest education level: None  Social Needs  . Financial resource strain: None  . Food insecurity - worry: None  . Food insecurity - inability: None  . Transportation needs - medical: None  . Transportation needs - non-medical: None  Occupational History  . None  Tobacco Use  . Smoking status: Former Research scientist (life sciences)  . Smokeless tobacco: Never Used  Substance and Sexual Activity  . Alcohol use: Yes    Alcohol/week: 0.0 oz    Comment: social   . Drug use: No  . Sexual activity: Yes    Partners: Male  Other Topics Concern  . None  Social History Narrative  . None   No Known Allergies Family History  Problem Relation Age of Onset  . Cancer Mother   . Cirrhosis Father   . Cancer Paternal Grandmother        ovarian   . Heart failure Paternal Grandfather      Past medical history, social, surgical and family history all reviewed in electronic medical record.  No pertanent information unless stated regarding to the chief complaint.   Review of Systems:Review of systems updated and as accurate as of 02/13/17  No  visual  changes, nausea, vomiting, diarrhea, constipation, dizziness, abdominal pain, skin rash, fevers, chills, night sweats, weight loss, swollen lymph nodes, body aches, joint swelling, muscle aches, chest pain, shortness of breath, mood changes. Positive headaches  Objective  Blood pressure 140/78, pulse 85, height 5\' 3"  (1.6 m), weight 141 lb (64 kg), SpO2 98 %. Systems examined below as of 02/13/17   General: No apparent distress alert and oriented x3 mood and affect normal, dressed appropriately.  HEENT: Pupils equal, extraocular movements intact  Respiratory: Patient's speak in full sentences and does not appear short of breath  Cardiovascular: No lower extremity edema, non tender, no erythema  Skin: Warm dry intact with no signs of infection or rash on extremities or on axial skeleton.  Abdomen: Soft nontender  Neuro: Cranial nerves II through XII are intact, neurovascularly intact in all extremities with 2+ DTRs and 2+ pulses.  Lymph: No lymphadenopathy of posterior or anterior cervical chain or axillae bilaterally.  Gait normal with good balance and coordination.  MSK:  Non tender with full range of motion and good stability and symmetric strength and tone of shoulders, elbows, wrist, hip, knee and ankles bilaterally.  Neck: Inspection loss of lordosis. No palpable stepoffs. Negative Spurling's maneuver. Mild loss of range of motion lacking the last 5 of rotation Grip strength and sensation normal in bilateral hands Strength good C4 to T1 distribution No sensory  change to C4 to T1 Negative Hoffman sign bilaterally Reflexes normal  Osteopathic findings C2 flexed rotated and side bent left  C4 flexed rotated and side bent left T3 extended rotated and side bent right inhaled third rib T5 extended rotated and side bent left     Impression and Recommendations:     This case required medical decision making of moderate complexity.      Note: This dictation was prepared with  Dragon dictation along with smaller phrase technology. Any transcriptional errors that result from this process are unintentional.

## 2017-02-13 ENCOUNTER — Encounter: Payer: Self-pay | Admitting: Family Medicine

## 2017-02-13 ENCOUNTER — Ambulatory Visit: Payer: 59 | Admitting: Family Medicine

## 2017-02-13 VITALS — BP 140/78 | HR 85 | Ht 63.0 in | Wt 141.0 lb

## 2017-02-13 DIAGNOSIS — M999 Biomechanical lesion, unspecified: Secondary | ICD-10-CM

## 2017-02-13 DIAGNOSIS — G4486 Cervicogenic headache: Secondary | ICD-10-CM

## 2017-02-13 DIAGNOSIS — R51 Headache: Secondary | ICD-10-CM

## 2017-02-13 MED ORDER — VENLAFAXINE HCL ER 75 MG PO CP24: 75.0000 mg | ORAL_CAPSULE | Freq: Every day | ORAL | 1 refills | Status: DC

## 2017-02-13 MED FILL — VENLAFAXINE HCL ER 75 MG CA: 75 | 30 days supply | Qty: 30 | Fill #0

## 2017-02-13 NOTE — Assessment & Plan Note (Signed)
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 Increase Effexor. Did respond to this very well over the course last year. No real side effects. Warned of some benefit in case the increase does occur. Patient will continue with the home exercises including the self manipulation. Follow-up again in 4 weeks.

## 2017-02-13 NOTE — Patient Instructions (Signed)
Great to see you  Keep me updated on the new regimen.  Effexor now 75mg  daily could help  Otherwise it is the same thing  Did get a lot of good movement See me again in 2-3 months

## 2017-02-13 NOTE — Assessment & Plan Note (Signed)
Decision today to treat with OMT was based on Physical Exam  After verbal consent patient was treated with HVLA, ME, FPR techniques in cervical, thoracic, rib, lumbar and sacral areas  Patient tolerated the procedure well with improvement in symptoms  Patient given exercises, stretches and lifestyle modifications  See medications in patient instructions if given  Patient will follow up in 4 weeks 

## 2017-03-12 MED FILL — VENLAFAXINE HCL ER 75 MG CA: 75 | 30 days supply | Qty: 30 | Fill #1

## 2017-03-14 ENCOUNTER — Encounter: Payer: Self-pay | Admitting: Family Medicine

## 2017-03-15 MED ORDER — VENLAFAXINE HCL ER 75 MG PO CP24: 75.0000 mg | ORAL_CAPSULE | Freq: Every day | ORAL | 2 refills | Status: DC

## 2017-04-11 MED FILL — VENLAFAXINE HCL ER 75 MG CA: 75 | 90 days supply | Qty: 90 | Fill #0

## 2017-04-24 ENCOUNTER — Ambulatory Visit: Payer: 59 | Admitting: Family Medicine

## 2017-04-24 ENCOUNTER — Encounter: Payer: Self-pay | Admitting: Family Medicine

## 2017-04-24 VITALS — BP 140/86 | HR 83 | Ht 63.0 in | Wt 136.0 lb

## 2017-04-24 DIAGNOSIS — M999 Biomechanical lesion, unspecified: Secondary | ICD-10-CM | POA: Diagnosis not present

## 2017-04-24 DIAGNOSIS — G4486 Cervicogenic headache: Secondary | ICD-10-CM

## 2017-04-24 DIAGNOSIS — R51 Headache: Secondary | ICD-10-CM

## 2017-04-24 NOTE — Assessment & Plan Note (Signed)
Patient has been doing relatively well.  Discussed icing regimen and home exercises.  No significant changes.  Patient is still on the Effexor at this moment.  No change in management.  Follow-up again in 4 weeks

## 2017-04-24 NOTE — Patient Instructions (Signed)
Thank you fo r what you do for our patients I am proud of you as well you have come a far way  Keep it up  I am here if you need me  See me again in 3 months

## 2017-04-24 NOTE — Progress Notes (Signed)
[name] [place], [place] [address] Phone: [phone] Subjective:        CC: Back and neck pain  Caitlyn [name]  Caitlyn [name] is a 42 y.o. female coming in with complaint of neck pain. She said that she has no change since last visit but is hoping that it is not as tight.  Patient has been doing fairly well for some time.  Has noticed some tightness.  Has been doing certain things such as home exercise, scapular exercises, as well as meditation and has noticed some improvement.       Past Medical History:  Diagnosis Date  . Headache    No past surgical history on file. Social History   Socioeconomic History  . Marital status: Married    Spouse name: None  . Number of children: None  . Years of education: None  . Highest education level: None  Social Needs  . Financial resource strain: None  . Food insecurity - worry: None  . Food insecurity - inability: None  . Transportation needs - medical: None  . Transportation needs - non-medical: None  Occupational History  . None  Tobacco Use  . Smoking status: Former Research scientist (life sciences)  . Smokeless tobacco: Never Used  Substance and Sexual Activity  . Alcohol use: Yes    Alcohol/week: 0.0 oz    Comment: social   . Drug use: No  . Sexual activity: Yes    Partners: Male  Other Topics Concern  . None  Social History Narrative  . None   No Known Allergies Family History  Problem Relation Age of Onset  . Cancer Mother   . Cirrhosis Father   . Cancer Paternal Grandmother        ovarian   . Heart failure Paternal Grandfather      Past medical history, social, surgical and family history all reviewed in electronic medical record.  No pertanent information unless stated regarding to the chief complaint.   Review of Systems:Review of systems updated and as accurate as of 04/24/17  No , visual changes, nausea, vomiting, diarrhea, constipation, dizziness, abdominal pain, skin rash,  fevers, chills, night sweats, weight loss, swollen lymph nodes, body aches, joint swelling, muscle aches, chest pain, shortness of breath, mood changes.   Objective  Blood pressure 140/86, pulse 83, height 5\' 3"  (1.6 m), weight 136 lb (61.7 kg), SpO2 99 %. Systems examined below as of 04/24/17   General: No apparent distress alert and oriented x3 mood and affect normal, dressed appropriately.  HEENT: Pupils equal, extraocular movements intact  Respiratory: Patient's speak in full sentences and does not appear short of breath  Cardiovascular: No lower extremity edema, non tender, no erythema  Skin: Warm dry intact with no signs of infection or rash on extremities or on axial skeleton.  Abdomen: Soft nontender  Neuro: Cranial nerves II through XII are intact, neurovascularly intact in all extremities with 2+ DTRs and 2+ pulses.  Lymph: No lymphadenopathy of posterior or anterior cervical chain or axillae bilaterally.  Gait normal with good balance and coordination.  MSK:  Non tender with full range of motion and good stability and symmetric strength and tone of shoulders, elbows, wrist, hip, knee and ankles bilaterally.   Neck: Inspection unremarkable. No palpable stepoffs. Negative Spurling's maneuver. Full neck range of motion Grip strength and sensation normal in bilateral hands Strength good C4 to T1 distribution No sensory change to C4 to T1 Negative Hoffman sign bilaterally  Reflexes normal Tightness in the right trapezius noted with what appears to be a slipped rib.  Osteopathic findings C2 flexed rotated and side bent right C4 flexed rotated and side bent left C7 flexed rotated and side bent left T3 extended rotated and side bent right inhaled third rib T5 extended rotated and side bent left       Impression and Recommendations:     This case required medical decision making of moderate complexity.      Note: This dictation was prepared with Dragon dictation along  with smaller phrase technology. Any transcriptional errors that result from this process are unintentional.

## 2017-04-24 NOTE — Assessment & Plan Note (Signed)
Decision today to treat with OMT was based on Physical Exam  After verbal consent patient was treated with HVLA, ME, FPR techniques in cervical, thoracic, rib areas  Patient tolerated the procedure well with improvement in symptoms  Patient given exercises, stretches and lifestyle modifications  See medications in patient instructions if given  Patient will follow up in 12 weeks 

## 2017-05-01 DIAGNOSIS — H5213 Myopia, bilateral: Secondary | ICD-10-CM | POA: Diagnosis not present

## 2017-06-28 MED FILL — VENLAFAXINE HCL ER 75 MG CA: 75 | 90 days supply | Qty: 90 | Fill #1

## 2017-07-17 DIAGNOSIS — L239 Allergic contact dermatitis, unspecified cause: Secondary | ICD-10-CM | POA: Diagnosis not present

## 2017-07-17 DIAGNOSIS — Z0000 Encounter for general adult medical examination without abnormal findings: Secondary | ICD-10-CM | POA: Diagnosis not present

## 2017-07-17 DIAGNOSIS — Z5181 Encounter for therapeutic drug level monitoring: Secondary | ICD-10-CM | POA: Diagnosis not present

## 2017-07-17 DIAGNOSIS — E785 Hyperlipidemia, unspecified: Secondary | ICD-10-CM | POA: Diagnosis not present

## 2017-08-01 MED FILL — TRIAMCINOLONE 0.1% CREAM: 0.1 | 30 days supply | Qty: 50 | Fill #0

## 2017-08-02 ENCOUNTER — Ambulatory Visit: Payer: 59 | Admitting: Family Medicine

## 2017-08-23 ENCOUNTER — Ambulatory Visit: Payer: 59 | Admitting: Family Medicine

## 2017-08-24 DIAGNOSIS — Z1231 Encounter for screening mammogram for malignant neoplasm of breast: Secondary | ICD-10-CM | POA: Diagnosis not present

## 2017-08-24 DIAGNOSIS — Z01419 Encounter for gynecological examination (general) (routine) without abnormal findings: Secondary | ICD-10-CM | POA: Diagnosis not present

## 2017-08-24 DIAGNOSIS — Z6823 Body mass index (BMI) 23.0-23.9, adult: Secondary | ICD-10-CM | POA: Diagnosis not present

## 2017-09-06 ENCOUNTER — Encounter: Payer: Self-pay | Admitting: Family Medicine

## 2017-09-06 ENCOUNTER — Ambulatory Visit: Payer: 59 | Admitting: Family Medicine

## 2017-09-06 VITALS — BP 114/82 | HR 81 | Ht 63.0 in | Wt 136.0 lb

## 2017-09-06 DIAGNOSIS — G4486 Cervicogenic headache: Secondary | ICD-10-CM

## 2017-09-06 DIAGNOSIS — R51 Headache: Secondary | ICD-10-CM | POA: Diagnosis not present

## 2017-09-06 DIAGNOSIS — M999 Biomechanical lesion, unspecified: Secondary | ICD-10-CM

## 2017-09-06 NOTE — Progress Notes (Signed)
[name] [place], [place] [address] Phone: [phone] Subjective:      CC: Neck pain  [id number]  Caitlyn [name] is a 42 y.o. female coming in with complaint of neck pain. Neck is doing ok today.   Has had it for quite some time.  Working on Engineer, building services.  No significant arthritic changes.  Discussed with patient but with activities of doing which wants to avoid.  Follow-up again in 4 to 8 weeks    Past Medical History:  Diagnosis Date  . Headache    No past surgical history on file. Social History   Socioeconomic History  . Marital status: Married    Spouse name: Not on file  . Number of children: Not on file  . Years of education: Not on file  . Highest education level: Not on file  Occupational History  . Not on file  Social Needs  . Financial resource strain: Not on file  . Food insecurity:    Worry: Not on file    Inability: Not on file  . Transportation needs:    Medical: Not on file    Non-medical: Not on file  Tobacco Use  . Smoking status: Former Research scientist (life sciences)  . Smokeless tobacco: Never Used  Substance and Sexual Activity  . Alcohol use: Yes    Alcohol/week: 0.0 oz    Comment: social   . Drug use: No  . Sexual activity: Yes    Partners: Male  Lifestyle  . Physical activity:    Days per week: Not on file    Minutes per session: Not on file  . Stress: Not on file  Relationships  . Social connections:    Talks on phone: Not on file    Gets together: Not on file    Attends religious service: Not on file    Active member of club or organization: Not on file    Attends meetings of clubs or organizations: Not on file    Relationship status: Not on file  Other Topics Concern  . Not on file  Social History Narrative  . Not on file   No Known Allergies Family History  Problem Relation Age of Onset  . Cancer Mother   . Cirrhosis Father   . Cancer Paternal Grandmother        ovarian   . Heart  failure Paternal Grandfather      Past medical history, social, surgical and family history all reviewed in electronic medical record.  No pertanent information unless stated regarding to the chief complaint.   Review of Systems:Review of systems updated and as accurate as of 09/06/17  No , visual changes, nausea, vomiting, diarrhea, constipation, dizziness, abdominal pain, skin rash, fevers, chills, night sweats, weight loss, swollen lymph nodes, body aches, joint swelling, chest pain, shortness of breath, mood changes.  Positive muscle aches, headaches  Objective  Blood pressure 114/82, pulse 81, height 5\' 3"  (1.6 m), weight 136 lb (61.7 kg), SpO2 99 %. Systems examined below as of 09/06/17   General: No apparent distress alert and oriented x3 mood and affect normal, dressed appropriately.  HEENT: Pupils equal, extraocular movements intact  Respiratory: Patient's speak in full sentences and does not appear short of breath  Cardiovascular: No lower extremity edema, non tender, no erythema  Skin: Warm dry intact with no signs of infection or rash on extremities or on axial skeleton.  Abdomen: Soft nontender  Neuro: Cranial nerves II  through XII are intact, neurovascularly intact in all extremities with 2+ DTRs and 2+ pulses.  Lymph: No lymphadenopathy of posterior or anterior cervical chain or axillae bilaterally.  Gait normal with good balance and coordination.  MSK:  Non tender with full range of motion and good stability and symmetric strength and tone of shoulders, elbows, wrist, hip, knee and ankles bilaterally.  Neck: Inspection mild loss of lordosis. No palpable stepoffs. Negative Spurling's maneuver. Full neck range of motion Grip strength and sensation normal in bilateral hands Strength good C4 to T1 distribution No sensory change to C4 to T1 Negative Hoffman sign bilaterally Reflexes normal Mild tightness of the trapezius bilaterally  Osteopathic findings C2 flexed  rotated and side bent right C4 flexed rotated and side bent left C7 flexed rotated and side bent right T3 extended rotated and side bent right inhaled third rib T5 extended rotated and side bent left      Impression and Recommendations:     This case required medical decision making of moderate complexity.      Note: This dictation was prepared with Dragon dictation along with smaller phrase technology. Any transcriptional errors that result from this process are unintentional.

## 2017-09-06 NOTE — Assessment & Plan Note (Signed)
Discussed posture and ergonomics.  Discussed which activities to do discussed avoiding certain activities.  Patient is to increase activity slowly.  Follow-up again in 12 weeks

## 2017-09-06 NOTE — Assessment & Plan Note (Signed)
Decision today to treat with OMT was based on Physical Exam  After verbal consent patient was treated with HVLA, ME, FPR techniques in cervical, thoracic, rib areas  Patient tolerated the procedure well with improvement in symptoms  Patient given exercises, stretches and lifestyle modifications  See medications in patient instructions if given  Patient will follow up in 12 weeks

## 2017-09-06 NOTE — Patient Instructions (Signed)
Good to see you  Overall doing well  Ice is your friend.  See me again in 3 months

## 2017-10-02 MED FILL — VENLAFAXINE HCL ER 75 MG CA: 75 | 90 days supply | Qty: 90 | Fill #2

## 2017-12-10 NOTE — Progress Notes (Signed)
[name] [place], [place] [address] Phone: [phone] Subjective:   Fontaine No, am serving as a scribe for Dr. Hulan Saas.    CC: Neck pain  [id number]  Caitlyn [name] is a 42 y.o. female coming in with complaint of neck pain. Is here for OMT. No changes since last visit.  Has been doing relatively well.  Continues to work out.  Notices it from day-to-day now.  No rhyme or reason why it occurs.     Past Medical History:  Diagnosis Date  . Headache    No past surgical history on file. Social History   Socioeconomic History  . Marital status: Married    Spouse name: Not on file  . Number of children: Not on file  . Years of education: Not on file  . Highest education level: Not on file  Occupational History  . Not on file  Social Needs  . Financial resource strain: Not on file  . Food insecurity:    Worry: Not on file    Inability: Not on file  . Transportation needs:    Medical: Not on file    Non-medical: Not on file  Tobacco Use  . Smoking status: Former Research scientist (life sciences)  . Smokeless tobacco: Never Used  Substance and Sexual Activity  . Alcohol use: Yes    Alcohol/week: 0.0 standard drinks    Comment: social   . Drug use: No  . Sexual activity: Yes    Partners: Male  Lifestyle  . Physical activity:    Days per week: Not on file    Minutes per session: Not on file  . Stress: Not on file  Relationships  . Social connections:    Talks on phone: Not on file    Gets together: Not on file    Attends religious service: Not on file    Active member of club or organization: Not on file    Attends meetings of clubs or organizations: Not on file    Relationship status: Not on file  Other Topics Concern  . Not on file  Social History Narrative  . Not on file   No Known Allergies Family History  Problem Relation Age of Onset  . Cancer Mother   . Cirrhosis Father   . Cancer Paternal Grandmother        ovarian    . Heart failure Paternal Grandfather          Current Outpatient Medications (Other):  .  triamcinolone cream (KENALOG) 0.1 %,  .  venlafaxine XR (EFFEXOR XR) 75 MG 24 hr capsule, Take 1 capsule (75 mg total) by mouth daily with breakfast.    Past medical history, social, surgical and family history all reviewed in electronic medical record.  No pertanent information unless stated regarding to the chief complaint.   Review of Systems:  No headache, visual changes, nausea, vomiting, diarrhea, constipation, dizziness, abdominal pain, skin rash, fevers, chills, night sweats, weight loss, swollen lymph nodes, body aches, joint swelling, chest pain, shortness of breath, mood changes.  Positive muscle aches  Objective  Blood pressure 130/86, pulse 91, height 5\' 3"  (1.6 m), weight 139 lb (63 kg), SpO2 98 %.    General: No apparent distress alert and oriented x3 mood and affect normal, dressed appropriately.  HEENT: Pupils equal, extraocular movements intact  Respiratory: Patient's speak in full sentences and does not appear short of breath  Cardiovascular: No lower extremity edema, non tender,  no erythema  Skin: Warm dry intact with no signs of infection or rash on extremities or on axial skeleton.  Abdomen: Soft nontender  Neuro: Cranial nerves II through XII are intact, neurovascularly intact in all extremities with 2+ DTRs and 2+ pulses.  Lymph: No lymphadenopathy of posterior or anterior cervical chain or axillae bilaterally.  Gait normal with good balance and coordination.  MSK:  Non tender with full range of motion and good stability and symmetric strength and tone of shoulders, elbows, wrist, hip, knee and ankles bilaterally.  Neck: Inspection mild loss of lordosis. No palpable stepoffs. Negative Spurling's maneuver. Full neck range of motion tenderness though noted at the occipital region. Grip strength and sensation normal in bilateral hands Strength good C4 to T1  distribution No sensory change to C4 to T1 Negative Hoffman sign bilaterally Reflexes normal Tightness of the right trapezius also noted.  Osteopathic findings C2 flexed rotated and side bent right C6 flexed rotated and side bent left T3 extended rotated and side bent right inhaled third rib T9 extended rotated and side bent left L4 flexed rotated and side bent right Sacrum right on right    Impression and Recommendations:     This case required medical decision making of moderate complexity. The above documentation has been reviewed and is accurate and complete [name], DO       Note: This dictation was prepared with Dragon dictation along with smaller phrase technology. Any transcriptional errors that result from this process are unintentional.

## 2017-12-11 ENCOUNTER — Ambulatory Visit: Payer: 59 | Admitting: Family Medicine

## 2017-12-11 ENCOUNTER — Encounter: Payer: Self-pay | Admitting: Family Medicine

## 2017-12-11 VITALS — BP 130/86 | HR 91 | Ht 63.0 in | Wt 139.0 lb

## 2017-12-11 DIAGNOSIS — R51 Headache: Secondary | ICD-10-CM

## 2017-12-11 DIAGNOSIS — M999 Biomechanical lesion, unspecified: Secondary | ICD-10-CM | POA: Diagnosis not present

## 2017-12-11 DIAGNOSIS — G4486 Cervicogenic headache: Secondary | ICD-10-CM

## 2017-12-11 NOTE — Assessment & Plan Note (Signed)
Decision today to treat with OMT was based on Physical Exam  After verbal consent patient was treated with HVLA, ME, FPR techniques in cervical, thoracic, rib, lumbar and sacral areas  Patient tolerated the procedure well with improvement in symptoms  Patient given exercises, stretches and lifestyle modifications  See medications in patient instructions if given  Patient will follow up in 12 weeks 

## 2017-12-11 NOTE — Patient Instructions (Signed)
Always great to see you  Ice is your friend Stay active Call me when you need me but see me again in 12 weeks

## 2017-12-11 NOTE — Assessment & Plan Note (Signed)
Doing well with cervicogenic headaches.  Discussed posture and ergonomics.  Doing well discussed strengthening of the scapular exercises.  Follow-up again in 4 to 8 weeks

## 2017-12-28 ENCOUNTER — Encounter: Payer: Self-pay | Admitting: Family Medicine

## 2017-12-31 ENCOUNTER — Other Ambulatory Visit: Payer: Self-pay | Admitting: *Deleted

## 2017-12-31 MED ORDER — VENLAFAXINE HCL ER 75 MG PO CP24: 75.0000 mg | ORAL_CAPSULE | Freq: Every day | ORAL | 2 refills | Status: DC

## 2017-12-31 MED FILL — VENLAFAXINE HCL ER 75 MG CA: 75 | 90 days supply | Qty: 90 | Fill #0

## 2018-03-05 ENCOUNTER — Encounter: Payer: Self-pay | Admitting: Family Medicine

## 2018-03-05 ENCOUNTER — Ambulatory Visit: Payer: 59 | Admitting: Family Medicine

## 2018-03-05 VITALS — BP 130/80 | HR 78 | Ht 63.0 in | Wt 138.0 lb

## 2018-03-05 DIAGNOSIS — G4486 Cervicogenic headache: Secondary | ICD-10-CM

## 2018-03-05 DIAGNOSIS — M999 Biomechanical lesion, unspecified: Secondary | ICD-10-CM | POA: Diagnosis not present

## 2018-03-05 DIAGNOSIS — R51 Headache: Secondary | ICD-10-CM

## 2018-03-05 NOTE — Assessment & Plan Note (Signed)
Decision today to treat with OMT was based on Physical Exam  After verbal consent patient was treated with HVLA, ME, FPR techniques in cervical, thoracic, rib areas  Patient tolerated the procedure well with improvement in symptoms  Patient given exercises, stretches and lifestyle modifications  See medications in patient instructions if given  Patient will follow up in 4 weeks 

## 2018-03-05 NOTE — Progress Notes (Signed)
[name] [name], [place] [address] Phone: [phone] Subjective:    I Caitlyn [name] am serving as a Education administrator for Dr. Hulan Saas.   CC: Neck and back pain  [id number]  Caitlyn [name] is a 43 y.o. female coming in with complaint of neck pain. States that she is doing well today but her pain is usually intermitted.  Patient has had increasing stress.  Patient did have the passing of her mother over the holidays.  Feels like that has started give her more pain.  Patient is also training for a half marathon.  Feels like that is also exacerbating some of the lower back pain that is not as usual.  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  Denies any weakness of any of the extremities.  Headaches are fairly well controlled.  Continues to take the Effexor regularly    Past Medical History:  Diagnosis Date  . Headache    No past surgical history on file. Social History   Socioeconomic History  . Marital status: Married    Spouse name: Not on file  . Number of children: Not on file  . Years of education: Not on file  . Highest education level: Not on file  Occupational History  . Not on file  Social Needs  . Financial resource strain: Not on file  . Food insecurity:    Worry: Not on file    Inability: Not on file  . Transportation needs:    Medical: Not on file    Non-medical: Not on file  Tobacco Use  . Smoking status: Former Research scientist (life sciences)  . Smokeless tobacco: Never Used  Substance and Sexual Activity  . Alcohol use: Yes    Alcohol/week: 0.0 standard drinks    Comment: social   . Drug use: No  . Sexual activity: Yes    Partners: Male  Lifestyle  . Physical activity:    Days per week: Not on file    Minutes per session: Not on file  . Stress: Not on file  Relationships  . Social connections:    Talks on phone: Not on file    Gets together: Not on file   Attends religious service: Not on file    Active member of club or organization: Not on file    Attends meetings of clubs or organizations: Not on file    Relationship status: Not on file  Other Topics Concern  . Not on file  Social History Narrative  . Not on file   No Known Allergies Family History  Problem Relation Age of Onset  . Cancer Mother   . Cirrhosis Father   . Cancer Paternal Grandmother        ovarian   . Heart failure Paternal Grandfather          Current Outpatient Medications (Other):  .  triamcinolone cream (KENALOG) 0.1 %,  .  venlafaxine XR (EFFEXOR XR) 75 MG 24 hr capsule, Take 1 capsule (75 mg total) by mouth daily with breakfast.    Past medical history, social, surgical and family history all reviewed in electronic medical record.  No pertanent information unless stated regarding to the chief complaint.   Review of Systems:  No headache, visual changes, nausea, vomiting, diarrhea, constipation, dizziness, abdominal pain, skin rash, fevers, chills, night sweats, weight loss, swollen lymph nodes, body aches, joint swelling,  chest pain, shortness of  breath, mood changes.  Positive muscle aches  Objective  Blood pressure 130/80, pulse 78, height 5\' 3"  (1.6 m), weight 138 lb (62.6 kg), SpO2 98 %.   General: No apparent distress alert and oriented x3 mood and affect normal, dressed appropriately.  HEENT: Pupils equal, extraocular movements intact  Respiratory: Patient's speak in full sentences and does not appear short of breath  Cardiovascular: No lower extremity edema, non tender, no erythema  Skin: Warm dry intact with no signs of infection or rash on extremities or on axial skeleton.  Abdomen: Soft nontender  Neuro: Cranial nerves II through XII are intact, neurovascularly intact in all extremities with 2+ DTRs and 2+ pulses.  Lymph: No lymphadenopathy of posterior or anterior cervical chain or axillae bilaterally.  Gait normal with good balance and  coordination.  MSK:  Non tender with full range of motion and good stability and symmetric strength and tone of shoulders, elbows, wrist, hip, knee and ankles bilaterally.  Neck: Inspection mild loss of lordosis. No palpable stepoffs. Negative Spurling's maneuver. Mild limitation in sidebending and rotation of the neck bilaterally. Grip strength and sensation normal in bilateral hands Strength good C4 to T1 distribution No sensory change to C4 to T1 Negative Hoffman sign bilaterally Reflexes normal Tightness in the trapezius right greater than left as well noted.  Osteopathic findings C2 flexed rotated and side bent right C6 flexed rotated and side bent left T3 extended rotated and side bent right inhaled third rib T8 extended rotated and side bent left L3 flexed rotated and side bent right Sacrum right on right    Impression and Recommendations:     This case required medical decision making of moderate complexity. The above documentation has been reviewed and is accurate and complete [name], DO       Note: This dictation was prepared with Dragon dictation along with smaller phrase technology. Any transcriptional errors that result from this process are unintentional.

## 2018-03-05 NOTE — Patient Instructions (Signed)
Always great to see you  Se eme again after the marathon you will do great

## 2018-03-05 NOTE — Assessment & Plan Note (Signed)
Stable overall.  I do believe that since some of the other exacerbating character such as the stress recently is likely contributing.  Continue the Effexor at the dose that she has had.  Discussed icing regimen and home exercise.  Discussed topical anti-inflammatories.  Patient is running and we will see her after the marathon.

## 2018-03-28 MED FILL — VENLAFAXINE HCL ER 75 MG CA: 75 | 90 days supply | Qty: 90 | Fill #1

## 2018-05-07 ENCOUNTER — Ambulatory Visit: Payer: 59 | Admitting: Family Medicine

## 2018-06-06 ENCOUNTER — Other Ambulatory Visit: Payer: Self-pay

## 2018-06-06 ENCOUNTER — Encounter: Payer: Self-pay | Admitting: Family Medicine

## 2018-06-06 ENCOUNTER — Ambulatory Visit: Payer: 59 | Admitting: Family Medicine

## 2018-06-06 VITALS — BP 134/78 | HR 88 | Ht 63.0 in | Wt 137.0 lb

## 2018-06-06 DIAGNOSIS — G4486 Cervicogenic headache: Secondary | ICD-10-CM

## 2018-06-06 DIAGNOSIS — R51 Headache: Secondary | ICD-10-CM

## 2018-06-06 DIAGNOSIS — M999 Biomechanical lesion, unspecified: Secondary | ICD-10-CM

## 2018-06-06 NOTE — Assessment & Plan Note (Signed)
Decision today to treat with OMT was based on Physical Exam  After verbal consent patient was treated with HVLA, ME, FPR techniques in cervical, thoracic, rib areas  Patient tolerated the procedure well with improvement in symptoms  Patient given exercises, stretches and lifestyle modifications  See medications in patient instructions if given  Patient will follow up in 6-8 weeks

## 2018-06-06 NOTE — Progress Notes (Signed)
[name] [address], [place] [address] Phone: [phone] Subjective:   I Caitlyn [name] am serving as a Education administrator for Dr. Hulan Saas.  CC: Neck pain follow-up  CNO:[id number]  Caitlyn [name] is a 43 y.o. female coming in with complaint of neck pain. States that she has been experiencing headaches.  Patient has had headaches for some time.  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  Does respond well to manipulation.  Has been quite some time.     Past Medical History:  Diagnosis Date  . Headache    No past surgical history on file. Social History   Socioeconomic History  . Marital status: Married    Spouse name: Not on file  . Number of children: Not on file  . Years of education: Not on file  . Highest education level: Not on file  Occupational History  . Not on file  Social Needs  . Financial resource strain: Not on file  . Food insecurity:    Worry: Not on file    Inability: Not on file  . Transportation needs:    Medical: Not on file    Non-medical: Not on file  Tobacco Use  . Smoking status: Former Research scientist (life sciences)  . Smokeless tobacco: Never Used  Substance and Sexual Activity  . Alcohol use: Yes    Alcohol/week: 0.0 standard drinks    Comment: social   . Drug use: No  . Sexual activity: Yes    Partners: Male  Lifestyle  . Physical activity:    Days per week: Not on file    Minutes per session: Not on file  . Stress: Not on file  Relationships  . Social connections:    Talks on phone: Not on file    Gets together: Not on file    Attends religious service: Not on file    Active member of club or organization: Not on file    Attends meetings of clubs or organizations: Not on file    Relationship status: Not on file  Other Topics Concern  . Not on file  Social History Narrative  . Not on file   No Known Allergies Family History  Problem Relation Age of Onset  . Cancer Mother   . Cirrhosis Father   . Cancer Paternal Grandmother         ovarian   . Heart failure Paternal Grandfather          Current Outpatient Medications (Other):  .  triamcinolone cream (KENALOG) 0.1 %,  .  venlafaxine XR (EFFEXOR XR) 75 MG 24 hr capsule, Take 1 capsule (75 mg total) by mouth daily with breakfast.    Past medical history, social, surgical and family history all reviewed in electronic medical record.  No pertanent information unless stated regarding to the chief complaint.   Review of Systems:  No  visual changes, nausea, vomiting, diarrhea, constipation, dizziness, abdominal pain, skin rash, fevers, chills, night sweats, weight loss, swollen lymph nodes, body aches, joint swelling,  chest pain, shortness of breath, mood changes.  Positive muscle aches and headaches  Objective  Blood pressure 134/78, pulse 88, height 5\' 3"  (1.6 m), weight 137 lb (62.1 kg), SpO2 98 %.   General: No apparent distress alert and oriented x3 mood and affect normal, dressed appropriately.  HEENT: Pupils equal, extraocular movements intact  Respiratory: Patient's speak in full sentences and does not appear short of breath  Cardiovascular: No lower extremity edema, non tender, no  erythema  Skin: Warm dry intact with no signs of infection or rash on extremities or on axial skeleton.  Abdomen: Soft nontender  Neuro: Cranial nerves II through XII are intact, neurovascularly intact in all extremities with 2+ DTRs and 2+ pulses.  Lymph: No lymphadenopathy of posterior or anterior cervical chain or axillae bilaterally.  Gait antalgic MSK:  tender with full range of motion and good stability and symmetric strength and tone of shoulders, elbows, wrist, hip, knee and ankles bilaterally.   Neck exam shows loss of lordosis.  Negative Spurling's noted.  Patient has good grip strength.  Mild tightness in the paraspinal musculature cervical spine. \ Osteopathic findings  C4 flexed rotated and side bent left C6 flexed rotated and side bent left T5 extended  rotated and side bent left inhaled rib T9 extended rotated and side bent left L2 flexed rotated and side bent right Sacrum right on right     Impression and Recommendations:     This case required medical decision making of moderate complexity. The above documentation has been reviewed and is accurate and complete Caitlyn Pulley, DO       Note: This dictation was prepared with Dragon dictation along with smaller phrase technology. Any transcriptional errors that result from this process are unintentional.

## 2018-06-06 NOTE — Assessment & Plan Note (Signed)
Headache on her own.  Responding well to manipulation.  Discussed icing regimen and home exercise. Discussed icing regimen and home exercise.  Discussed posture and ergonomics.  Follow-up again 6 to 8 weeks

## 2018-06-24 MED FILL — VENLAFAXINE HCL ER 75 MG CA: 75 | 90 days supply | Qty: 90 | Fill #2

## 2018-08-13 DIAGNOSIS — E785 Hyperlipidemia, unspecified: Secondary | ICD-10-CM | POA: Diagnosis not present

## 2018-08-13 DIAGNOSIS — Z0000 Encounter for general adult medical examination without abnormal findings: Secondary | ICD-10-CM | POA: Diagnosis not present

## 2018-08-13 DIAGNOSIS — Z5181 Encounter for therapeutic drug level monitoring: Secondary | ICD-10-CM | POA: Diagnosis not present

## 2018-08-13 MED FILL — FLUCONAZOLE 150 MG TABS: 150 | 28 days supply | Qty: 4 | Fill #0

## 2018-08-13 MED FILL — NYSTATIN 100,000 UNIT/GM CR: 100000 | 60 days supply | Qty: 60 | Fill #0

## 2018-08-19 ENCOUNTER — Encounter: Payer: Self-pay | Admitting: Family Medicine

## 2018-08-20 ENCOUNTER — Encounter: Payer: Self-pay | Admitting: Family Medicine

## 2018-08-20 ENCOUNTER — Other Ambulatory Visit: Payer: Self-pay

## 2018-08-20 ENCOUNTER — Ambulatory Visit (INDEPENDENT_AMBULATORY_CARE_PROVIDER_SITE_OTHER)
Admission: RE | Admit: 2018-08-20 | Discharge: 2018-08-20 | Disposition: A | Discharge status: Home / Self Care | Payer: 59 | Source: Ambulatory Visit | Attending: Family Medicine | Admitting: Family Medicine

## 2018-08-20 ENCOUNTER — Ambulatory Visit (INDEPENDENT_AMBULATORY_CARE_PROVIDER_SITE_OTHER): Payer: 59 | Admitting: Family Medicine

## 2018-08-20 VITALS — BP 124/70 | HR 80 | Ht 63.0 in | Wt 137.0 lb

## 2018-08-20 DIAGNOSIS — M25551 Pain in right hip: Secondary | ICD-10-CM | POA: Diagnosis not present

## 2018-08-20 DIAGNOSIS — M25552 Pain in left hip: Secondary | ICD-10-CM

## 2018-08-20 DIAGNOSIS — M999 Biomechanical lesion, unspecified: Secondary | ICD-10-CM | POA: Diagnosis not present

## 2018-08-20 DIAGNOSIS — M7602 Gluteal tendinitis, left hip: Secondary | ICD-10-CM | POA: Insufficient documentation

## 2018-08-20 DIAGNOSIS — G4486 Cervicogenic headache: Secondary | ICD-10-CM

## 2018-08-20 DIAGNOSIS — M67952 Unspecified disorder of synovium and tendon, left thigh: Secondary | ICD-10-CM

## 2018-08-20 DIAGNOSIS — R51 Headache: Secondary | ICD-10-CM

## 2018-08-20 NOTE — Assessment & Plan Note (Signed)
Decision today to treat with OMT was based on Physical Exam  After verbal consent patient was treated with HVLA, ME, FPR techniques in cervical, thoracic, rib areas  Patient tolerated the procedure well with improvement in symptoms  Patient given exercises, stretches and lifestyle modifications  See medications in patient instructions if given  Patient will follow up in 3 weeks

## 2018-08-20 NOTE — Progress Notes (Signed)
[name] [address] [place] [address], [place] [address] Phone: [phone] Subjective:   I [name] am serving as a Education administrator for Dr. Hulan Saas.  I'm seeing this patient by the request  of:    CC: Right hip pain, neck pain,  [id number]  Caitlyn [name] is a 43 y.o. female coming in with complaint of right hip pain. Remembers running when she caught a cramp in her side. Continued running and the pain got worse. Continued to run a few days after. Limping.   Onset- a week and a half ago Location - glut and anterior hip  Character- sharp, achy  Aggravating factors- Therapies tried-   patient has done some icing and dry needling with minimal improvement Severity-8 out of 10 when it starts.  He been noticing walking     Past Medical History:  Diagnosis Date  . Headache    No past surgical history on file. Social History   Socioeconomic History  . Marital status: Married    Spouse name: Not on file  . Number of children: Not on file  . Years of education: Not on file  . Highest education level: Not on file  Occupational History  . Not on file  Social Needs  . Financial resource strain: Not on file  . Food insecurity    Worry: Not on file    Inability: Not on file  . Transportation needs    Medical: Not on file    Non-medical: Not on file  Tobacco Use  . Smoking status: Former Research scientist (life sciences)  . Smokeless tobacco: Never Used  Substance and Sexual Activity  . Alcohol use: Yes    Alcohol/week: 0.0 standard drinks    Comment: social   . Drug use: No  . Sexual activity: Yes    Partners: Male  Lifestyle  . Physical activity    Days per week: Not on file    Minutes per session: Not on file  . Stress: Not on file  Relationships  . Social Herbalist on phone: Not on file    Gets together: Not on file    Attends religious service: Not on file    Active member of club or organization: Not on file    Attends meetings of clubs or  organizations: Not on file    Relationship status: Not on file  Other Topics Concern  . Not on file  Social History Narrative  . Not on file   No Known Allergies Family History  Problem Relation Age of Onset  . Cancer Mother   . Cirrhosis Father   . Cancer Paternal Grandmother        ovarian   . Heart failure Paternal Grandfather          Current Outpatient Medications (Other):  .  triamcinolone cream (KENALOG) 0.1 %,  .  venlafaxine XR (EFFEXOR XR) 75 MG 24 hr capsule, Take 1 capsule (75 mg total) by mouth daily with breakfast.    Past medical history, social, surgical and family history all reviewed in electronic medical record.  No pertanent information unless stated regarding to the chief complaint.   Review of Systems:  No headache, visual changes, nausea, vomiting, diarrhea, constipation, dizziness, abdominal pain, skin rash, fevers, chills, night sweats, weight loss, swollen lymph nodes, body aches, joint swelling, muscle aches, chest pain, shortness of breath, mood changes.   Objective  Blood pressure 124/70, pulse 80, height 5\' 3"  (1.6 m), weight  137 lb (62.1 kg), SpO2 99 %.   General: No apparent distress alert and oriented x3 mood and affect normal, dressed appropriately.  HEENT: Pupils equal, extraocular movements intact  Respiratory: Patient's speak in full sentences and does not appear short of breath  Cardiovascular: No lower extremity edema, non tender, no erythema  Skin: Warm dry intact with no signs of infection or rash on extremities or on axial skeleton.  Abdomen: Soft nontender  Neuro: Cranial nerves II through XII are intact, neurovascularly intact in all extremities with 2+ DTRs and 2+ pulses.  Lymph: No lymphadenopathy of posterior or anterior cervical chain or axillae bilaterally.  Gait normal with good balance and coordination.  MSK:  Non tender with full range of motion and good stability and symmetric strength and tone of shoulders, elbows,  wrist, knee and ankles bilaterally.  Hip: Right hip ROM IR: 15 Deg, ER: 25 Deg, Flexion: 120 Deg, Extension: 100 Deg, Abduction: 45 Deg, Adduction: 45 Deg Strength Mild weakness of hip abductors 4 out of 5 Pelvic alignment unremarkable to inspection and palpation. Standing hip rotation and gait without trendelenburg sign / unsteadiness. Greater trochanter without tenderness to palpation. More pain over the gluteal area or gluteal medius No SI joint tenderness and normal minimal SI movement.  Neck: Inspection unremarkable. No palpable stepoffs. Negative Spurling's maneuver. Full neck range of motion Grip strength and sensation normal in bilateral hands Strength good C4 to T1 distribution No sensory change to C4 to T1 Negative Hoffman sign bilaterally Reflexes normal Mild tightness of the trapezius bilaterally.  Osteopathic findings C2 flexed rotated and side bent right C6 flexed rotated and side bent left T3 extended rotated and side bent right inhaled third rib T4 extended rotated and side bent left L2 flexed rotated and side bent right Sacrum right on right    Impression and Recommendations:     This case required medical decision making of moderate complexity. The above documentation has been reviewed and is accurate and complete [name], DO       Note: This dictation was prepared with Dragon dictation along with smaller phrase technology. Any transcriptional errors that result from this process are unintentional.

## 2018-08-20 NOTE — Assessment & Plan Note (Signed)
Patient does have more of a partial tear noted on the gluteal tendon.  We discussed with him.  We discussed the stability.  This is right-sided.  Discussed which activities of doing which was to avoid as well as the strengthening exercises.  Patient will avoid certain movement.  We discussed the possibility of x-rays.  Follow-up again in 3 weeks

## 2018-08-20 NOTE — Patient Instructions (Signed)
No running for 2 weeks Bike and elliptical is ok See me again in 4 weeks

## 2018-08-20 NOTE — Assessment & Plan Note (Signed)
Cervicogenic.  Discussed with patient about posture discussed which activities.  Patient has responded well previously to manipulation 1 time.  Follow-up again in 4 to 8 weeks

## 2018-09-23 ENCOUNTER — Other Ambulatory Visit: Payer: Self-pay

## 2018-09-23 ENCOUNTER — Encounter: Payer: Self-pay | Admitting: Family Medicine

## 2018-09-23 MED ORDER — VENLAFAXINE HCL ER 75 MG PO CP24: 75.0000 mg | ORAL_CAPSULE | Freq: Every day | ORAL | 2 refills | Status: DC

## 2018-09-23 MED FILL — VENLAFAXINE HCL ER 75 MG CA: 75 | 90 days supply | Qty: 90 | Fill #0

## 2018-09-23 NOTE — Progress Notes (Signed)
[name] [address] [place], [place] [address] Phone: [phone] Subjective:   Caitlyn [name], am serving as a scribe for Dr. Hulan Saas.   CC: neck and back pain   [id number]  Caitlyn [name] is a 43 y.o. female coming in with complaint of neck pain. Patient states that her right glute is doing better as well. Has tried to run less       Past Medical History:  Diagnosis Date  . Headache    [name] past surgical history on file. Social History   Socioeconomic History  . Marital status: Married    Spouse name: Not on file  . Number of children: Not on file  . Years of education: Not on file  . Highest education level: Not on file  Occupational History  . Not on file  Social Needs  . Financial resource strain: Not on file  . Food insecurity    Worry: Not on file    Inability: Not on file  . Transportation needs    Medical: Not on file    Non-medical: Not on file  Tobacco Use  . Smoking status: Former Research scientist (life sciences)  . Smokeless tobacco: Never Used  Substance and Sexual Activity  . Alcohol use: Yes    Alcohol/week: 0.0 standard drinks    Comment: social   . Drug use: [name]  . Sexual activity: Yes    Partners: Male  Lifestyle  . Physical activity    Days per week: Not on file    Minutes per session: Not on file  . Stress: Not on file  Relationships  . Social Herbalist on phone: Not on file    Gets together: Not on file    Attends religious service: Not on file    Active member of club or organization: Not on file    Attends meetings of clubs or organizations: Not on file    Relationship status: Not on file  Other Topics Concern  . Not on file  Social History Narrative  . Not on file   [name] Known Allergies Family History  Problem Relation Age of Onset  . Cancer Mother   . Cirrhosis Father   . Cancer Paternal Grandmother        ovarian   . Heart failure Paternal Grandfather          Current Outpatient  Medications (Other):  .  triamcinolone cream (KENALOG) 0.1 %,  .  venlafaxine XR (EFFEXOR XR) 75 MG 24 hr capsule, Take 1 capsule (75 mg total) by mouth daily with breakfast.    Past medical history, social, surgical and family history all reviewed in electronic medical record.  [name] pertanent information unless stated regarding to the chief complaint.   Review of Systems:  [name] headache, visual changes, nausea, vomiting, diarrhea, constipation, dizziness, abdominal pain, skin rash, fevers, chills, night sweats, weight loss, swollen lymph nodes, body aches, joint swelling, muscle aches, chest pain, shortness of breath, mood changes.   Objective  Blood pressure 124/88, pulse 92, height 5\' 3"  (1.6 m), weight 138 lb (62.6 kg), SpO2 98 %.   General: [name] apparent distress alert and oriented x3 mood and affect normal, dressed appropriately.  HEENT: Pupils equal, extraocular movements intact  Respiratory: Patient's speak in full sentences and does not appear short of breath  Cardiovascular: [name] lower extremity edema, non tender, [name] erythema  Skin: Warm dry intact with [name] signs of infection or rash on extremities  or on axial skeleton.  Abdomen: Soft nontender  Neuro: Cranial nerves II through XII are intact, neurovascularly intact in all extremities with 2+ DTRs and 2+ pulses.  Lymph: [name] lymphadenopathy of posterior or anterior cervical chain or axillae bilaterally.  Gait normal with good balance and coordination.  MSK:  Non tender with full range of motion and good stability and symmetric strength and tone of shoulders, elbows, wrist, hip, knee and ankles bilaterally.  Neck: Inspection.  Mild loss of lordosis. [name] palpable stepoffs. Negative Spurling's maneuver. Full neck range of motion Grip strength and sensation normal in bilateral hands Strength good C4 to T1 distribution [name] sensory change to C4 to T1 Negative Hoffman sign bilaterally Reflexes normal Tightness in the trapezius bilaterally   Left gluteal tendon pain from previous exam Osteopathic findings C2 flexed rotated and side bent right C7 flexed rotated and side bent left T3 extended rotated and side bent right inhaled third rib T6 extended rotated and side bent left L4 flexed rotated and side bent left  Sacrum right on right    Impression and Recommendations:     This case required medical decision making of moderate complexity. The above documentation has been reviewed and is accurate and complete [name], DO       Note: This dictation was prepared with Dragon dictation along with smaller phrase technology. Any transcriptional errors that result from this process are unintentional.

## 2018-09-24 ENCOUNTER — Encounter: Payer: Self-pay | Admitting: Family Medicine

## 2018-09-24 ENCOUNTER — Other Ambulatory Visit: Payer: Self-pay

## 2018-09-24 ENCOUNTER — Ambulatory Visit (INDEPENDENT_AMBULATORY_CARE_PROVIDER_SITE_OTHER): Payer: 59 | Admitting: Family Medicine

## 2018-09-24 VITALS — BP 124/88 | HR 92 | Ht 63.0 in | Wt 138.0 lb

## 2018-09-24 DIAGNOSIS — M999 Biomechanical lesion, unspecified: Secondary | ICD-10-CM

## 2018-09-24 DIAGNOSIS — R51 Headache: Secondary | ICD-10-CM

## 2018-09-24 DIAGNOSIS — G4486 Cervicogenic headache: Secondary | ICD-10-CM

## 2018-09-24 NOTE — Patient Instructions (Addendum)
Good to see you Good luck with homeschool See me again in 6-7 weeks

## 2018-09-24 NOTE — Assessment & Plan Note (Signed)
Symptoms are better manipulation previously.  Continues to do so.  Was having some mild increasing discomfort.  Doing well with the Effexor.  Follow-up again 4 to 8 weeks.

## 2018-09-24 NOTE — Assessment & Plan Note (Signed)
Decision today to treat with OMT was based on Physical Exam  After verbal consent patient was treated with HVLA, ME, FPR techniques in cervical, thoracic rib, lumbar and sacral areas  Patient tolerated the procedure well with improvement in symptoms  Patient given exercises, stretches and lifestyle modifications  See medications in patient instructions if given  Patient will follow up in 4-8 weeks

## 2018-10-11 DIAGNOSIS — Z1231 Encounter for screening mammogram for malignant neoplasm of breast: Secondary | ICD-10-CM | POA: Diagnosis not present

## 2018-10-11 DIAGNOSIS — Z6824 Body mass index (BMI) 24.0-24.9, adult: Secondary | ICD-10-CM | POA: Diagnosis not present

## 2018-10-11 DIAGNOSIS — Z01419 Encounter for gynecological examination (general) (routine) without abnormal findings: Secondary | ICD-10-CM | POA: Diagnosis not present

## 2018-11-01 ENCOUNTER — Ambulatory Visit: Payer: 59 | Admitting: Family Medicine

## 2018-11-29 ENCOUNTER — Ambulatory Visit: Payer: 59 | Admitting: Family Medicine

## 2018-12-13 ENCOUNTER — Ambulatory Visit: Payer: 59 | Admitting: Family Medicine

## 2018-12-25 MED FILL — VENLAFAXINE HCL ER 75 MG CA: 75 | 90 days supply | Qty: 90 | Fill #1

## 2018-12-27 ENCOUNTER — Ambulatory Visit: Payer: 59 | Admitting: Family Medicine

## 2019-01-17 ENCOUNTER — Other Ambulatory Visit: Payer: Self-pay

## 2019-01-17 ENCOUNTER — Ambulatory Visit (INDEPENDENT_AMBULATORY_CARE_PROVIDER_SITE_OTHER): Payer: 59 | Admitting: Family Medicine

## 2019-01-17 ENCOUNTER — Encounter: Payer: Self-pay | Admitting: Family Medicine

## 2019-01-17 VITALS — BP 122/80 | HR 96 | Ht 63.0 in | Wt 137.2 lb

## 2019-01-17 DIAGNOSIS — R519 Headache, unspecified: Secondary | ICD-10-CM | POA: Diagnosis not present

## 2019-01-17 DIAGNOSIS — M999 Biomechanical lesion, unspecified: Secondary | ICD-10-CM | POA: Diagnosis not present

## 2019-01-17 DIAGNOSIS — G4486 Cervicogenic headache: Secondary | ICD-10-CM

## 2019-01-17 NOTE — Assessment & Plan Note (Signed)
Decision today to treat with OMT was based on Physical Exam  After verbal consent patient was treated with HVLA, ME, FPR techniques in cervical, thoracic, rib lumbar and sacral areas  Patient tolerated the procedure well with improvement in symptoms  Patient given exercises, stretches and lifestyle modifications  See medications in patient instructions if given  Patient will follow up in 4-8 weeks 

## 2019-01-17 NOTE — Progress Notes (Signed)
[name] [address] [place], [place] [address] Phone: [phone] Subjective:    CC: Neck pain  I, Wendy Poet, LAT, ATC, am serving as scribe for Dr. Hulan Saas.   [id number]    09/24/18: Symptoms are better manipulation previously.  Continues to do so.  Was having some mild increasing discomfort.  Doing well with the Effexor.  Follow-up again 4 to 8 weeks.  01/17/19: Caitlyn [name] is a 42 y.o. female coming in for f/u of neck pain.  She states that her neck has been doing well overall, just a few a headaches recently.  She notes that she 's been feeling better than she has years.     Past Medical History:  Diagnosis Date  . Headache    No past surgical history on file. Social History   Socioeconomic History  . Marital status: Married    Spouse name: Not on file  . Number of children: Not on file  . Years of education: Not on file  . Highest education level: Not on file  Occupational History  . Not on file  Tobacco Use  . Smoking status: Former Research scientist (life sciences)  . Smokeless tobacco: Never Used  Substance and Sexual Activity  . Alcohol use: Yes    Alcohol/week: 0.0 standard drinks    Comment: social   . Drug use: No  . Sexual activity: Yes    Partners: Male  Other Topics Concern  . Not on file  Social History Narrative  . Not on file   Social Determinants of Health   Financial Resource Strain:   . Difficulty of Paying Living Expenses: Not on file  Food Insecurity:   . Worried About Charity fundraiser in the Last Year: Not on file  . Ran Out of Food in the Last Year: Not on file  Transportation Needs:   . Lack of Transportation (Medical): Not on file  . Lack of Transportation (Non-Medical): Not on file  Physical Activity:   . Days of Exercise per Week: Not on file  . Minutes of Exercise per Session: Not on file  Stress:   . Feeling of Stress : Not on file  Social Connections:   . Frequency of Communication with Friends  and Family: Not on file  . Frequency of Social Gatherings with Friends and Family: Not on file  . Attends Religious Services: Not on file  . Active Member of Clubs or Organizations: Not on file  . Attends Archivist Meetings: Not on file  . Marital Status: Not on file   No Known Allergies Family History  Problem Relation Age of Onset  . Cancer Mother   . Cirrhosis Father   . Cancer Paternal Grandmother        ovarian   . Heart failure Paternal Grandfather          Current Outpatient Medications (Other):  .  triamcinolone cream (KENALOG) 0.1 %,  .  venlafaxine XR (EFFEXOR XR) 75 MG 24 hr capsule, Take 1 capsule (75 mg total) by mouth daily with breakfast.    Past medical history, social, surgical and family history all reviewed in electronic medical record.  No pertanent information unless stated regarding to the chief complaint.   Review of Systems:  No headache, visual changes, nausea, vomiting, diarrhea, constipation, dizziness, abdominal pain, skin rash, fevers, chills, night sweats, weight loss, swollen lymph nodes, body aches, joint swelling, muscle aches, chest pain, shortness of breath, mood changes.  Objective  Blood pressure 122/80, pulse 96, height 5\' 3"  (1.6 m), weight 137 lb 3.2 oz (62.2 kg), SpO2 99 %. Systems examined below as of    General: No apparent distress alert and oriented x3 mood and affect normal, dressed appropriately.  HEENT: Pupils equal, extraocular movements intact  Respiratory: Patient's speak in full sentences and does not appear short of breath  Cardiovascular: No lower extremity edema, non tender, no erythema  Skin: Warm dry intact with no signs of infection or rash on extremities or on axial skeleton.  Abdomen: Soft nontender  Neuro: Cranial nerves II through XII are intact, neurovascularly intact in all extremities with 2+ DTRs and 2+ pulses.  Lymph: No lymphadenopathy of posterior or anterior cervical chain or axillae  bilaterally.  Gait normal with good balance and coordination.  MSK:  Non tender with full range of motion and good stability and symmetric strength and tone of shoulders, elbows, wrist, hip, knee and ankles bilaterally.  Back exam doing very well at the moment.  Mild tightness in the parascapular region right greater than left.  Patient has full range of motion of the shoulders bilaterally.  Mild pain in the thoracolumbar juncture.  Negative Spurling's of the neck.  Osteopathic findings  C2 flexed rotated and side bent right C4 flexed rotated and side bent left C7 flexed rotated and side bent left T3 extended rotated and side bent right inhaled third rib T9 extended rotated and side bent left L2 flexed rotated and side bent right Sacrum right on right    Impression and Recommendations:     This case required medical decision making of moderate complexity. The above documentation has been reviewed and is accurate and complete [name], DO       Note: This dictation was prepared with Dragon dictation along with smaller phrase technology. Any transcriptional errors that result from this process are unintentional.

## 2019-01-17 NOTE — Patient Instructions (Signed)
Doing great. See me in 3 months. Happy holidays.

## 2019-01-17 NOTE — Assessment & Plan Note (Signed)
Cervicogenic noted.  Discussed which activities to do which wants to avoid.  Increase activity as tolerated.  Follow-up again in 4 to 8 weeks

## 2019-01-24 DIAGNOSIS — H5213 Myopia, bilateral: Secondary | ICD-10-CM | POA: Diagnosis not present

## 2019-03-27 MED FILL — VENLAFAXINE HCL ER 75 MG CA: 75 | 90 days supply | Qty: 90 | Fill #2

## 2019-03-28 ENCOUNTER — Ambulatory Visit: Payer: 59 | Admitting: Family Medicine

## 2019-04-03 ENCOUNTER — Encounter: Payer: Self-pay | Admitting: Family Medicine

## 2019-04-03 ENCOUNTER — Ambulatory Visit (INDEPENDENT_AMBULATORY_CARE_PROVIDER_SITE_OTHER): Payer: 59 | Admitting: Family Medicine

## 2019-04-03 ENCOUNTER — Other Ambulatory Visit: Payer: Self-pay

## 2019-04-03 VITALS — BP 140/74 | HR 82 | Ht 63.0 in | Wt 138.0 lb

## 2019-04-03 DIAGNOSIS — R519 Headache, unspecified: Secondary | ICD-10-CM | POA: Diagnosis not present

## 2019-04-03 DIAGNOSIS — M999 Biomechanical lesion, unspecified: Secondary | ICD-10-CM

## 2019-04-03 DIAGNOSIS — G4486 Cervicogenic headache: Secondary | ICD-10-CM

## 2019-04-03 NOTE — Progress Notes (Signed)
[facility] [address] [place] Phone: [phone] Subjective:   I Caitlyn [name] am serving as a Education administrator for Dr. Hulan Saas.  This visit occurred during the SARS-CoV-2 public health emergency.  Safety protocols were in place, including screening questions prior to the visit, additional usage of staff PPE, and extensive cleaning of exam room while observing appropriate contact time as indicated for disinfecting solutions.   I'm seeing this patient by the request  of:  Carlos Levering, PA-C  CC: Neck pain and headache follow-up  [id number]  [name] is a 44 y.o. female coming in with complaint of back pain. Last seen 01/17/2019 for OMT. Patient states she has been doing well. Headaches are doing better and she recently ran a marathon.  Patient still has some mild difficulty with her right hip she states with running.  Just feels not quite the same.  Has been working on the hip abductors fairly regularly.  Continues to do strengthening exercises as well.     Past Medical History:  Diagnosis Date  . Headache    No past surgical history on file. Social History   Socioeconomic History  . Marital status: Married    Spouse name: Not on file  . Number of children: Not on file  . Years of education: Not on file  . Highest education level: Not on file  Occupational History  . Not on file  Tobacco Use  . Smoking status: Former Research scientist (life sciences)  . Smokeless tobacco: Never Used  Substance and Sexual Activity  . Alcohol use: Yes    Alcohol/week: 0.0 standard drinks    Comment: social   . Drug use: No  . Sexual activity: Yes    Partners: Male  Other Topics Concern  . Not on file  Social History Narrative  . Not on file   Social Determinants of Health   Financial Resource Strain:   . Difficulty of Paying Living Expenses: Not on file  Food Insecurity:   . Worried About Charity fundraiser in the Last Year: Not on file  . Ran  Out of Food in the Last Year: Not on file  Transportation Needs:   . Lack of Transportation (Medical): Not on file  . Lack of Transportation (Non-Medical): Not on file  Physical Activity:   . Days of Exercise per Week: Not on file  . Minutes of Exercise per Session: Not on file  Stress:   . Feeling of Stress : Not on file  Social Connections:   . Frequency of Communication with Friends and Family: Not on file  . Frequency of Social Gatherings with Friends and Family: Not on file  . Attends Religious Services: Not on file  . Active Member of Clubs or Organizations: Not on file  . Attends Archivist Meetings: Not on file  . Marital Status: Not on file   No Known Allergies Family History  Problem Relation Age of Onset  . Cancer Mother   . Cirrhosis Father   . Cancer Paternal Grandmother        ovarian   . Heart failure Paternal Grandfather          Current Outpatient Medications (Other):  .  triamcinolone cream (KENALOG) 0.1 %,  .  venlafaxine XR (EFFEXOR XR) 75 MG 24 hr capsule, Take 1 capsule (75 mg total) by mouth daily with breakfast.   Reviewed prior external information including notes and imaging from  primary care provider  As well as notes that were available from care everywhere and other healthcare systems.  Past medical history, social, surgical and family history all reviewed in electronic medical record.  No pertanent information unless stated regarding to the chief complaint.   Review of Systems:  No headache, visual changes, nausea, vomiting, diarrhea, constipation, dizziness, abdominal pain, skin rash, fevers, chills, night sweats, weight loss, swollen lymph nodes, body aches, joint swelling, chest pain, shortness of breath, mood changes. POSITIVE muscle aches  Objective  Blood pressure 140/74, pulse 82, height 5\' 3"  (1.6 m), weight 138 lb (62.6 kg), SpO2 99 %.   General: No apparent distress alert and oriented x3 mood and affect normal, dressed  appropriately.  HEENT: Pupils equal, extraocular movements intact  Respiratory: Patient's speak in full sentences and does not appear short of breath  Cardiovascular: No lower extremity edema, non tender, no erythema  Skin: Warm dry intact with no signs of infection or rash on extremities or on axial skeleton.  Abdomen: Soft nontender  Neuro: Cranial nerves II through XII are intact, neurovascularly intact in all extremities with 2+ DTRs and 2+ pulses.  Lymph: No lymphadenopathy of posterior or anterior cervical chain or axillae bilaterally.  Gait normal with good balance and coordination.  MSK:  Non tender with full range of motion and good stability and symmetric strength and tone of shoulders, elbows, wrist, hip, knee and ankles bilaterally.  Neck exam mild loss of lordosis.  Tender to palpation in the paraspinal musculature lumbar spine minorly more at the thoracolumbar juncture.  Some pain in the parascapular region as well.  Improvement in range of motion.  Good strength though noted with no true scapular winging  Osteopathic findings C2 flexed rotated and side bent right C4 flexed rotated and side bent left T9 extended rotated and side bent left L2 flexed rotated and side bent right Sacrum right on right    Impression and Recommendations:     This case required medical decision making of moderate complexity. The above documentation has been reviewed and is accurate and complete Caitlyn Pulley, DO       Note: This dictation was prepared with Dragon dictation along with smaller phrase technology. Any transcriptional errors that result from this process are unintentional.

## 2019-04-03 NOTE — Patient Instructions (Addendum)
Good to see you Good luck with the run! See me again in 8 weeks

## 2019-04-04 ENCOUNTER — Encounter: Payer: Self-pay | Admitting: Family Medicine

## 2019-04-04 NOTE — Assessment & Plan Note (Signed)
Cervicogenic headaches.  Discussed which activities to do which wants to avoid.  Increase activity slowly over the course the next several weeks.  Patient should monitor home exercises, icing regimen, chronic condition but seems to be stable.  Follow-up again in 4 to 8 weeks

## 2019-04-04 NOTE — Assessment & Plan Note (Signed)
Decision today to treat with OMT was based on Physical Exam  After verbal consent patient was treated with HVLA, ME, FPR techniques in cervical, thoracic, rib,  areas  Patient tolerated the procedure well with improvement in symptoms  Patient given exercises, stretches and lifestyle modifications  See medications in patient instructions if given  Patient will follow up in 4-8 weeks 

## 2019-05-20 ENCOUNTER — Encounter: Payer: Self-pay | Admitting: Family Medicine

## 2019-05-20 MED ORDER — VENLAFAXINE HCL ER 37.5 MG PO CP24: 37.5000 mg | ORAL_CAPSULE | Freq: Every day | ORAL | 1 refills | Status: DC

## 2019-05-20 MED FILL — VENLAFAXINE HCL ER 37.5 MG: 37.5 | 90 days supply | Qty: 90 | Fill #0

## 2019-05-23 ENCOUNTER — Ambulatory Visit: Payer: 59 | Admitting: Family Medicine

## 2019-08-01 ENCOUNTER — Encounter: Payer: Self-pay | Admitting: Family Medicine

## 2019-08-01 ENCOUNTER — Other Ambulatory Visit: Payer: Self-pay

## 2019-08-01 ENCOUNTER — Ambulatory Visit (INDEPENDENT_AMBULATORY_CARE_PROVIDER_SITE_OTHER): Payer: 59 | Admitting: Family Medicine

## 2019-08-01 VITALS — BP 120/70 | HR 80 | Ht 63.0 in | Wt 137.0 lb

## 2019-08-01 DIAGNOSIS — R519 Headache, unspecified: Secondary | ICD-10-CM

## 2019-08-01 DIAGNOSIS — G4486 Cervicogenic headache: Secondary | ICD-10-CM

## 2019-08-01 DIAGNOSIS — M999 Biomechanical lesion, unspecified: Secondary | ICD-10-CM | POA: Diagnosis not present

## 2019-08-01 NOTE — Progress Notes (Signed)
[name] [place] Phone: [phone] Subjective:   Caitlyn [name], am serving as a scribe for Dr. Hulan Saas. This visit occurred during the SARS-CoV-2 public health emergency.  Safety protocols were in place, including screening questions prior to the visit, additional usage of staff PPE, and extensive cleaning of exam room while observing appropriate contact time as indicated for disinfecting solutions.   I'm seeing this patient by the request  of:  Carlos Levering, PA-C  CC: Neck and back pain follow-up  [id number]  Caitlyn [name] is a 44 y.o. female coming in with complaint of back and neck pain. Last seen 04/03/2019 for OMT. Patient states patient states she has been doing good been feeling a little tight but not bad.  Continue to stay active doing.  Bar  Medications patient has been prescribed: Effexor still taking it          Reviewed prior external information including notes and imaging from previsou exam, outside providers and external EMR if available.   As well as notes that were available from care everywhere and other healthcare systems.  Past medical history, social, surgical and family history all reviewed in electronic medical record.  No pertanent information unless stated regarding to the chief complaint.   Past Medical History:  Diagnosis Date  . Headache     No Known Allergies   Review of Systems:  No  visual changes, nausea, vomiting, diarrhea, constipation, dizziness, abdominal pain, skin rash, fevers, chills, night sweats, weight loss, swollen lymph nodes, body aches, joint swelling, chest pain, shortness of breath, mood changes. POSITIVE muscle aches, headache  Objective  Blood pressure 120/70, pulse 80, height 5\' 3"  (1.6 m), weight 137 lb (62.1 kg), SpO2 99 %.   General: No apparent distress alert and oriented x3 mood and affect normal, dressed appropriately.  HEENT: Pupils  equal, extraocular movements intact  Respiratory: Patient's speak in full sentences and does not appear short of breath  Cardiovascular: No lower extremity edema, non tender, no erythema  Neuro: Cranial nerves II through XII are intact, neurovascularly intact in all extremities with 2+ DTRs and 2+ pulses.  Gait normal with good balance and coordination.  MSK:  Non tender with full range of motion and good stability and symmetric strength and tone of shoulders, elbows, wrist, hip, knee and ankles bilaterally.  Back - Normal skin, Spine with normal alignment and no deformity.  No tenderness to vertebral process palpation.  Paraspinous muscles are not tender and without spasm.   Range of motion is full at neck and lumbar sacral regions  Osteopathic findings  C2 flexed rotated and side bent right C6 flexed rotated and side bent left T3 extended rotated and side bent right inhaled rib T7 extended rotated and side bent left      Assessment and Plan: Cervicogenic headache Patient has been fairly stable overall.  Doing relatively well with the Effexor at the moment.  No change in medications.  Discussed icing regimen and home exercises.  Follow-up again 4 to 8 weeks    Nonallopathic problems  Decision today to treat with OMT was based on Physical Exam  After verbal consent patient was treated with HVLA, ME, FPR techniques in cervical, rib, thoracic, lumbar, and sacral  areas  Patient tolerated the procedure well with improvement in symptoms  Patient given exercises, stretches and lifestyle modifications  See medications in patient instructions if given  Patient will follow up in 4-8  weeks      The above documentation has been reviewed and is accurate and complete Caitlyn Pulley, DO       Note: This dictation was prepared with Dragon dictation along with smaller phrase technology. Any transcriptional errors that result from this process are unintentional.

## 2019-08-01 NOTE — Assessment & Plan Note (Signed)
Patient has been fairly stable overall.  Doing relatively well with the Effexor at the moment.  No change in medications.  Discussed icing regimen and home exercises.  Follow-up again 4 to 8 weeks

## 2019-08-07 MED FILL — VENLAFAXINE HCL ER 37.5 MG: 37.5 | 90 days supply | Qty: 90 | Fill #1

## 2019-08-21 DIAGNOSIS — Z0000 Encounter for general adult medical examination without abnormal findings: Secondary | ICD-10-CM | POA: Diagnosis not present

## 2019-08-21 DIAGNOSIS — Z5181 Encounter for therapeutic drug level monitoring: Secondary | ICD-10-CM | POA: Diagnosis not present

## 2019-08-21 DIAGNOSIS — Z13220 Encounter for screening for lipoid disorders: Secondary | ICD-10-CM | POA: Diagnosis not present

## 2019-08-21 MED FILL — TRIAMCINOLONE 0.1% OINTMENT: 0.1 | 20 days supply | Qty: 60 | Fill #0

## 2019-11-07 DIAGNOSIS — Z23 Encounter for immunization: Secondary | ICD-10-CM | POA: Diagnosis not present

## 2019-11-22 ENCOUNTER — Encounter: Payer: Self-pay | Admitting: Family Medicine

## 2019-11-24 ENCOUNTER — Other Ambulatory Visit: Payer: Self-pay

## 2019-11-24 MED ORDER — VENLAFAXINE HCL ER 75 MG PO CP24: 75.0000 mg | ORAL_CAPSULE | Freq: Every day | ORAL | 2 refills | Status: DC

## 2019-11-24 MED FILL — VENLAFAXINE HCL ER 75 MG CA: 75 | 90 days supply | Qty: 90 | Fill #0

## 2020-01-20 DIAGNOSIS — Z6824 Body mass index (BMI) 24.0-24.9, adult: Secondary | ICD-10-CM | POA: Diagnosis not present

## 2020-01-20 DIAGNOSIS — Z1231 Encounter for screening mammogram for malignant neoplasm of breast: Secondary | ICD-10-CM | POA: Diagnosis not present

## 2020-01-20 DIAGNOSIS — Z01419 Encounter for gynecological examination (general) (routine) without abnormal findings: Secondary | ICD-10-CM | POA: Diagnosis not present

## 2020-02-19 MED FILL — VENLAFAXINE HCL ER 75 MG CA: 75 | 90 days supply | Qty: 90 | Fill #1

## 2020-02-20 DIAGNOSIS — Z30433 Encounter for removal and reinsertion of intrauterine contraceptive device: Secondary | ICD-10-CM | POA: Diagnosis not present

## 2020-03-12 DIAGNOSIS — H5213 Myopia, bilateral: Secondary | ICD-10-CM | POA: Diagnosis not present

## 2020-03-21 ENCOUNTER — Encounter: Payer: Self-pay | Admitting: Family Medicine

## 2020-03-22 ENCOUNTER — Other Ambulatory Visit: Payer: Self-pay | Admitting: Family Medicine

## 2020-03-22 MED ORDER — PREDNISONE 20 MG PO TABS: 20.0000 mg | ORAL_TABLET | Freq: Every day | ORAL | 0 refills | Status: DC

## 2020-03-22 MED FILL — predniSONE 20 MG TABS: 20 | 5 days supply | Qty: 5 | Fill #0

## 2020-03-30 DIAGNOSIS — Z30431 Encounter for routine checking of intrauterine contraceptive device: Secondary | ICD-10-CM | POA: Diagnosis not present

## 2020-03-30 DIAGNOSIS — N939 Abnormal uterine and vaginal bleeding, unspecified: Secondary | ICD-10-CM | POA: Diagnosis not present

## 2020-04-05 NOTE — Progress Notes (Deleted)
[facility] Phone: [phone] Subjective:    I'm seeing this patient by the request  of:  Carlos Levering, PA-C  CC: Knee pain  [id number]  Caitlyn [name] is a 45 y.o. female coming in with complaint of ***  Onset-  Location Duration-  Character- Aggravating factors- Reliving factors-  Therapies tried-  Severity-     Past Medical History:  Diagnosis Date  . Headache    No past surgical history on file. Social History   Socioeconomic History  . Marital status: Married    Spouse name: Not on file  . Number of children: Not on file  . Years of education: Not on file  . Highest education level: Not on file  Occupational History  . Not on file  Tobacco Use  . Smoking status: Former Research scientist (life sciences)  . Smokeless tobacco: Never Used  Substance and Sexual Activity  . Alcohol use: Yes    Alcohol/week: 0.0 standard drinks    Comment: social   . Drug use: No  . Sexual activity: Yes    Partners: Male  Other Topics Concern  . Not on file  Social History Narrative  . Not on file   Social Determinants of Health   Financial Resource Strain: Not on file  Food Insecurity: Not on file  Transportation Needs: Not on file  Physical Activity: Not on file  Stress: Not on file  Social Connections: Not on file   No Known Allergies Family History  Problem Relation Age of Onset  . Cancer Mother   . Cirrhosis Father   . Cancer Paternal Grandmother        ovarian   . Heart failure Paternal Grandfather     Current Outpatient Medications (Endocrine & Metabolic):  .  predniSONE (DELTASONE) 20 MG tablet, Take 1 tablet (20 mg total) by mouth daily with breakfast.      Current Outpatient Medications (Other):  .  triamcinolone cream (KENALOG) 0.1 %,  .  venlafaxine XR (EFFEXOR XR) 37.5 MG 24 hr capsule, Take 1 capsule (37.5 mg total) by mouth daily with breakfast. .  venlafaxine XR (EFFEXOR XR) 75 MG 24  hr capsule, Take 1 capsule (75 mg total) by mouth daily with breakfast.   Reviewed prior external information including notes and imaging from  primary care provider As well as notes that were available from care everywhere and other healthcare systems.  Past medical history, social, surgical and family history all reviewed in electronic medical record.  No pertanent information unless stated regarding to the chief complaint.   Review of Systems:  No headache, visual changes, nausea, vomiting, diarrhea, constipation, dizziness, abdominal pain, skin rash, fevers, chills, night sweats, weight loss, swollen lymph nodes, body aches, joint swelling, chest pain, shortness of breath, mood changes. POSITIVE muscle aches  Objective  There were no vitals taken for this visit.   General: No apparent distress alert and oriented x3 mood and affect normal, dressed appropriately.  HEENT: Pupils equal, extraocular movements intact  Respiratory: Patient's speak in full sentences and does not appear short of breath  Cardiovascular: No lower extremity edema, non tender, no erythema  Gait normal with good balance and coordination.  MSK:  Non tender with full range of motion and good stability and symmetric strength and tone of shoulders, elbows, wrist, hip, knee and ankles bilaterally.     Impression and Recommendations:     The above documentation has been reviewed and is  accurate and complete [name], DO

## 2020-04-06 ENCOUNTER — Ambulatory Visit: Payer: 59 | Admitting: Family Medicine

## 2020-04-10 ENCOUNTER — Encounter: Payer: Self-pay | Admitting: Family Medicine

## 2020-04-13 ENCOUNTER — Other Ambulatory Visit: Payer: Self-pay

## 2020-04-13 ENCOUNTER — Ambulatory Visit: Payer: 59 | Admitting: Family Medicine

## 2020-04-13 ENCOUNTER — Encounter: Payer: Self-pay | Admitting: Family Medicine

## 2020-04-13 ENCOUNTER — Ambulatory Visit (INDEPENDENT_AMBULATORY_CARE_PROVIDER_SITE_OTHER): Payer: 59

## 2020-04-13 ENCOUNTER — Ambulatory Visit: Payer: Self-pay

## 2020-04-13 VITALS — BP 138/82 | HR 90 | Ht 63.0 in

## 2020-04-13 DIAGNOSIS — S76301A Unspecified injury of muscle, fascia and tendon of the posterior muscle group at thigh level, right thigh, initial encounter: Secondary | ICD-10-CM

## 2020-04-13 DIAGNOSIS — M25561 Pain in right knee: Secondary | ICD-10-CM

## 2020-04-13 DIAGNOSIS — M79662 Pain in left lower leg: Secondary | ICD-10-CM

## 2020-04-13 NOTE — Patient Instructions (Signed)
Focus on eccentric hamstring Thigh compression sleeve Ok to run flat surfaces Decrease stride length Double Vit D Xray today See me in 4-6 weeks

## 2020-04-13 NOTE — Progress Notes (Signed)
[name] [place] Phone: [phone] Subjective:   Caitlyn [name], am serving as a scribe for Dr. Hulan Saas.  This visit occurred during the SARS-CoV-2 public health emergency.  Safety protocols were in place, including screening questions prior to the visit, additional usage of staff PPE, and extensive cleaning of exam room while observing appropriate contact time as indicated for disinfecting solutions.   I'm seeing this patient by the request  of:  Carlos Levering, PA-C  CC: Right knee pain, left lower leg pain  [id number]  Caitlyn [name] is a 45 y.o. female coming in with complaint of left shin and right knee pain. Has been having pain for a few weeks during her runs. Was able to complete a race at the end of February. Patient states that the right knee has been bothering her so much she has not been able to run since her last run on Feb 19th. Patient has the pain only when running or going down steps. States today she is fine because she has been resting.  Patient states that she has been making some progress overall.  Patient wants to continue to stay active.      Past Medical History:  Diagnosis Date   Headache    History reviewed. No pertinent surgical history. Social History   Socioeconomic History   Marital status: Married    Spouse name: Not on file   Number of children: Not on file   Years of education: Not on file   Highest education level: Not on file  Occupational History   Not on file  Tobacco Use   Smoking status: Former Smoker   Smokeless tobacco: Never Used  Substance and Sexual Activity   Alcohol use: Yes    Alcohol/week: 0.0 standard drinks    Comment: social    Drug use: No   Sexual activity: Yes    Partners: Male  Other Topics Concern   Not on file  Social History Narrative   Not on file   Social Determinants of Health   Financial Resource Strain: Not on  file  Food Insecurity: Not on file  Transportation Needs: Not on file  Physical Activity: Not on file  Stress: Not on file  Social Connections: Not on file   No Known Allergies Family History  Problem Relation Age of Onset   Cancer Mother    Cirrhosis Father    Cancer Paternal Grandmother        ovarian    Heart failure Paternal Grandfather          Current Outpatient Medications (Other):    triamcinolone cream (KENALOG) 0.1 %,    venlafaxine XR (EFFEXOR XR) 75 MG 24 hr capsule, Take 1 capsule (75 mg total) by mouth daily with breakfast.   Reviewed prior external information including notes and imaging from  primary care provider As well as notes that were available from care everywhere and other healthcare systems.  Past medical history, social, surgical and family history all reviewed in electronic medical record.  No pertanent information unless stated regarding to the chief complaint.   Review of Systems:  No headache, visual changes, nausea, vomiting, diarrhea, constipation, dizziness, abdominal pain, skin rash, fevers, chills, night sweats, weight loss, swollen lymph nodes, , joint swelling, chest pain, shortness of breath, mood changes. POSITIVE muscle aches, body aches  Objective  Blood pressure 138/82, pulse 90, height 5\' 3"  (1.6 m), SpO2 99 %.  General: No apparent distress alert and oriented x3 mood and affect normal, dressed appropriately.  HEENT: Pupils equal, extraocular movements intact  Respiratory: Patient's speak in full sentences and does not appear short of breath  Cardiovascular: No lower extremity edema, non tender, no erythema  Gait normal with good balance and coordination.  MSK: Right knee exam shows the patient has good range of motion.  Tender to palpation a little bit over the lateral aspect of the knee.  Worsening pain with resisted flexion of the knee.  Patient has negative McMurray's.  No tenderness over the medial joint  space. Limited musculoskeletal ultrasound was performed and interpreted by [name]  Limited ultrasound of patient's right knee shows the patient does have some increasing in Doppler flow in neovascularization at the musculotendinous junction of the lateral hamstring.  No cortical irregularity of the fibular head noted.  Patient does have some mild degenerative changes noted of the lateral meniscus.  No swelling of the patellofemoral joint though noted. Impression: Likely small tear of the distal hamstring at the musculotendinous juncture.    Impression and Recommendations:     The above documentation has been reviewed and is accurate and complete [name], DO

## 2020-04-13 NOTE — Assessment & Plan Note (Signed)
Patient has what appears to be more of the lateral hamstring tendon muscular junction tear noted.  Known seen on ultrasound.  Patient's knee may have some mild degenerative changes of the lateral meniscus noted with no significant displacement.  X-rays of the knee are pending.  Discussed with patient with icing regimen, home exercises.  Continuing to stay active.  Discussed compression sleeve of the thigh that could be helpful.  Decreasing stride length.  Follow-up with me again 4 to 6 weeks.  Worsening pain will consider the possibility of advanced imaging but I cannot think patient will do well.

## 2020-04-29 ENCOUNTER — Ambulatory Visit: Payer: 59 | Admitting: Family Medicine

## 2020-05-23 MED FILL — Venlafaxine HCl Cap ER 24HR 75 MG (Base Equivalent): ORAL | 90 days supply | Qty: 90 | Fill #0 | Status: AC

## 2020-05-24 ENCOUNTER — Other Ambulatory Visit (HOSPITAL_COMMUNITY): Payer: Self-pay

## 2020-05-26 ENCOUNTER — Ambulatory Visit: Payer: 59 | Admitting: Family Medicine

## 2020-06-23 ENCOUNTER — Other Ambulatory Visit (HOSPITAL_COMMUNITY): Payer: Self-pay

## 2020-07-02 ENCOUNTER — Ambulatory Visit: Payer: 59 | Admitting: Family Medicine

## 2020-08-17 ENCOUNTER — Encounter: Payer: Self-pay | Admitting: Family Medicine

## 2020-08-18 ENCOUNTER — Other Ambulatory Visit (HOSPITAL_COMMUNITY): Payer: Self-pay

## 2020-08-18 ENCOUNTER — Other Ambulatory Visit: Payer: Self-pay

## 2020-08-18 MED ORDER — VENLAFAXINE HCL ER 75 MG PO CP24
75.0000 mg | ORAL_CAPSULE | Freq: Every day | ORAL | 1 refills | Status: DC
  Filled 2020-08-18: qty 90, 90d supply, fill #0
  Filled 2020-11-18: qty 90, 90d supply, fill #1

## 2020-08-31 DIAGNOSIS — Z0000 Encounter for general adult medical examination without abnormal findings: Secondary | ICD-10-CM | POA: Diagnosis not present

## 2020-08-31 DIAGNOSIS — E559 Vitamin D deficiency, unspecified: Secondary | ICD-10-CM | POA: Diagnosis not present

## 2020-08-31 DIAGNOSIS — E611 Iron deficiency: Secondary | ICD-10-CM | POA: Diagnosis not present

## 2020-08-31 DIAGNOSIS — Z5181 Encounter for therapeutic drug level monitoring: Secondary | ICD-10-CM | POA: Diagnosis not present

## 2020-08-31 DIAGNOSIS — E785 Hyperlipidemia, unspecified: Secondary | ICD-10-CM | POA: Diagnosis not present

## 2020-11-11 DIAGNOSIS — Z23 Encounter for immunization: Secondary | ICD-10-CM | POA: Diagnosis not present

## 2020-11-18 ENCOUNTER — Other Ambulatory Visit (HOSPITAL_COMMUNITY): Payer: Self-pay

## 2021-02-17 ENCOUNTER — Other Ambulatory Visit: Payer: Self-pay | Admitting: Family Medicine

## 2021-02-17 ENCOUNTER — Other Ambulatory Visit (HOSPITAL_COMMUNITY): Payer: Self-pay

## 2021-02-17 MED ORDER — VENLAFAXINE HCL ER 75 MG PO CP24
75.0000 mg | ORAL_CAPSULE | Freq: Every day | ORAL | 1 refills | Status: DC
  Filled 2021-02-17: qty 90, 90d supply, fill #0
  Filled 2021-05-15: qty 90, 90d supply, fill #1

## 2021-02-24 DIAGNOSIS — Z803 Family history of malignant neoplasm of breast: Secondary | ICD-10-CM | POA: Diagnosis not present

## 2021-02-24 NOTE — Progress Notes (Signed)
[facility] [place] [address] [place] Phone: [phone] Subjective:   IVilma Meckel, am serving as a scribe for Dr. Hulan Saas. This visit occurred during the SARS-CoV-2 public health emergency.  Safety protocols were in place, including screening questions prior to the visit, additional usage of staff PPE, and extensive cleaning of exam room while observing appropriate contact time as indicated for disinfecting solutions.   I'm seeing this patient by the request  of:  Carlos Levering, PA-C  CC: neck pain   [id number]  Seen 04/2020 for shin and knee pain  Caitlyn [name] is a 46 y.o. female coming in with complaint of neck pain.  Here for manipulation. OMT on 08/01/2019. No other complaints.  The patient has been doing relatively well but continues to have some discomfort and pain.  Nothing that is stopped her from activity.  Continues the same medications.       Past Medical History:  Diagnosis Date   Headache    No past surgical history on file. Social History   Socioeconomic History   Marital status: Married    Spouse name: Not on file   Number of children: Not on file   Years of education: Not on file   Highest education level: Not on file  Occupational History   Not on file  Tobacco Use   Smoking status: Former   Smokeless tobacco: Never  Substance and Sexual Activity   Alcohol use: Yes    Alcohol/week: 0.0 standard drinks    Comment: social    Drug use: No   Sexual activity: Yes    Partners: Male  Other Topics Concern   Not on file  Social History Narrative   Not on file   Social Determinants of Health   Financial Resource Strain: Not on file  Food Insecurity: Not on file  Transportation Needs: Not on file  Physical Activity: Not on file  Stress: Not on file  Social Connections: Not on file   No Known Allergies Family History  Problem Relation Age of Onset   Cancer Mother    Cirrhosis Father     Cancer Paternal Grandmother        ovarian    Heart failure Paternal Grandfather          Current Outpatient Medications (Other):    triamcinolone cream (KENALOG) 0.1 %,    venlafaxine XR (EFFEXOR-XR) 75 MG 24 hr capsule, Take 1 capsule (75 mg total) by mouth daily with breakfast.   Reviewed prior external information including notes and imaging from  primary care provider As well as notes that were available from care everywhere and other healthcare systems.  Past medical history, social, surgical and family history all reviewed in electronic medical record.  No pertanent information unless stated regarding to the chief complaint.   Review of Systems:  No headache, visual changes, nausea, vomiting, diarrhea, constipation, dizziness, abdominal pain, skin rash, fevers, chills, night sweats, weight loss, swollen lymph nodes, body aches, joint swelling, chest pain, shortness of breath, mood changes. POSITIVE muscle aches  Objective  There were no vitals taken for this visit.   General: No apparent distress alert and oriented x3 mood and affect normal, dressed appropriately.  HEENT: Pupils equal, extraocular movements intact  Respiratory: Patient's speak in full sentences and does not appear short of breath  Neck exam does have some loss of lordosis.  Some tenderness to palpation of the paraspinal musculature.  Very mild tightness noted  of the neck.  Patient does have some mild tightness noted in the parascapular region right greater than left.  Osteopathic findings C2 flexed rotated and side bent right C4 flexed rotated and side bent left C6 flexed rotated and side bent left T3 extended rotated and side bent right inhaled third rib     Impression and Recommendations:     The above documentation has been reviewed and is accurate and complete [name], DO

## 2021-03-01 ENCOUNTER — Ambulatory Visit: Payer: 59 | Admitting: Family Medicine

## 2021-03-01 ENCOUNTER — Other Ambulatory Visit: Payer: Self-pay

## 2021-03-01 VITALS — BP 122/82 | HR 74 | Ht 63.0 in | Wt 142.0 lb

## 2021-03-01 DIAGNOSIS — G4486 Cervicogenic headache: Secondary | ICD-10-CM

## 2021-03-01 DIAGNOSIS — M9901 Segmental and somatic dysfunction of cervical region: Secondary | ICD-10-CM

## 2021-03-01 DIAGNOSIS — M9908 Segmental and somatic dysfunction of rib cage: Secondary | ICD-10-CM

## 2021-03-01 DIAGNOSIS — M9902 Segmental and somatic dysfunction of thoracic region: Secondary | ICD-10-CM

## 2021-03-01 NOTE — Assessment & Plan Note (Signed)
Chronic but stable.  Has been doing relatively well.  No need for any other large changes.  Continue the Effexor.  Responds well to manipulation.  Follow-up with me again as needed

## 2021-03-03 DIAGNOSIS — Z803 Family history of malignant neoplasm of breast: Secondary | ICD-10-CM | POA: Diagnosis not present

## 2021-03-11 DIAGNOSIS — H5213 Myopia, bilateral: Secondary | ICD-10-CM | POA: Diagnosis not present

## 2021-03-15 ENCOUNTER — Ambulatory Visit: Payer: 59 | Admitting: Family Medicine

## 2021-03-18 DIAGNOSIS — Z01419 Encounter for gynecological examination (general) (routine) without abnormal findings: Secondary | ICD-10-CM | POA: Diagnosis not present

## 2021-03-18 DIAGNOSIS — Z1231 Encounter for screening mammogram for malignant neoplasm of breast: Secondary | ICD-10-CM | POA: Diagnosis not present

## 2021-03-18 DIAGNOSIS — Z124 Encounter for screening for malignant neoplasm of cervix: Secondary | ICD-10-CM | POA: Diagnosis not present

## 2021-03-18 DIAGNOSIS — Z6823 Body mass index (BMI) 23.0-23.9, adult: Secondary | ICD-10-CM | POA: Diagnosis not present

## 2021-03-22 ENCOUNTER — Other Ambulatory Visit: Payer: Self-pay | Admitting: Obstetrics and Gynecology

## 2021-03-22 DIAGNOSIS — R928 Other abnormal and inconclusive findings on diagnostic imaging of breast: Secondary | ICD-10-CM

## 2021-03-24 ENCOUNTER — Ambulatory Visit
Admission: RE | Admit: 2021-03-24 | Discharge: 2021-03-24 | Disposition: A | Discharge status: Home / Self Care | Payer: 59 | Source: Ambulatory Visit | Attending: Obstetrics and Gynecology | Admitting: Obstetrics and Gynecology

## 2021-03-24 ENCOUNTER — Other Ambulatory Visit: Payer: Self-pay | Admitting: Obstetrics and Gynecology

## 2021-03-24 DIAGNOSIS — R921 Mammographic calcification found on diagnostic imaging of breast: Secondary | ICD-10-CM | POA: Diagnosis not present

## 2021-03-24 DIAGNOSIS — R928 Other abnormal and inconclusive findings on diagnostic imaging of breast: Secondary | ICD-10-CM

## 2021-03-31 ENCOUNTER — Ambulatory Visit
Admission: RE | Admit: 2021-03-31 | Discharge: 2021-03-31 | Disposition: A | Discharge status: Home / Self Care | Payer: 59 | Source: Ambulatory Visit | Attending: Obstetrics and Gynecology | Admitting: Obstetrics and Gynecology

## 2021-03-31 ENCOUNTER — Other Ambulatory Visit: Payer: Self-pay | Admitting: Obstetrics and Gynecology

## 2021-03-31 DIAGNOSIS — R921 Mammographic calcification found on diagnostic imaging of breast: Secondary | ICD-10-CM

## 2021-03-31 DIAGNOSIS — N6011 Diffuse cystic mastopathy of right breast: Secondary | ICD-10-CM | POA: Diagnosis not present

## 2021-05-16 ENCOUNTER — Other Ambulatory Visit (HOSPITAL_COMMUNITY): Payer: Self-pay

## 2021-07-14 ENCOUNTER — Other Ambulatory Visit: Payer: Self-pay | Admitting: Anesthesiology

## 2021-07-14 ENCOUNTER — Other Ambulatory Visit: Payer: Self-pay | Admitting: General Surgery

## 2021-07-14 DIAGNOSIS — Z803 Family history of malignant neoplasm of breast: Secondary | ICD-10-CM

## 2021-07-27 ENCOUNTER — Ambulatory Visit
Admission: RE | Admit: 2021-07-27 | Discharge: 2021-07-27 | Disposition: A | Discharge status: Home / Self Care | Payer: 59 | Source: Ambulatory Visit | Attending: General Surgery | Admitting: General Surgery

## 2021-07-27 ENCOUNTER — Other Ambulatory Visit: Payer: 59

## 2021-07-27 DIAGNOSIS — Z803 Family history of malignant neoplasm of breast: Secondary | ICD-10-CM

## 2021-07-27 DIAGNOSIS — N6489 Other specified disorders of breast: Secondary | ICD-10-CM | POA: Diagnosis not present

## 2021-07-27 MED ORDER — GADOBUTROL 1 MMOL/ML IV SOLN
7.0000 mL | Freq: Once | INTRAVENOUS | Status: AC | PRN
  Administered 2021-07-27: 7 mL via INTRAVENOUS

## 2021-08-13 ENCOUNTER — Other Ambulatory Visit: Payer: Self-pay | Admitting: Family Medicine

## 2021-08-16 ENCOUNTER — Other Ambulatory Visit (HOSPITAL_COMMUNITY): Payer: Self-pay

## 2021-08-16 MED ORDER — VENLAFAXINE HCL ER 75 MG PO CP24
75.0000 mg | ORAL_CAPSULE | Freq: Every day | ORAL | 1 refills | Status: DC
  Filled 2021-08-16: qty 90, 90d supply, fill #0

## 2021-10-31 DIAGNOSIS — E559 Vitamin D deficiency, unspecified: Secondary | ICD-10-CM | POA: Diagnosis not present

## 2021-10-31 DIAGNOSIS — R7301 Impaired fasting glucose: Secondary | ICD-10-CM | POA: Diagnosis not present

## 2021-10-31 DIAGNOSIS — E611 Iron deficiency: Secondary | ICD-10-CM | POA: Diagnosis not present

## 2021-10-31 DIAGNOSIS — E785 Hyperlipidemia, unspecified: Secondary | ICD-10-CM | POA: Diagnosis not present

## 2021-10-31 DIAGNOSIS — Z5181 Encounter for therapeutic drug level monitoring: Secondary | ICD-10-CM | POA: Diagnosis not present

## 2021-11-03 DIAGNOSIS — E559 Vitamin D deficiency, unspecified: Secondary | ICD-10-CM | POA: Diagnosis not present

## 2021-11-03 DIAGNOSIS — D649 Anemia, unspecified: Secondary | ICD-10-CM | POA: Diagnosis not present

## 2021-11-03 DIAGNOSIS — J309 Allergic rhinitis, unspecified: Secondary | ICD-10-CM | POA: Diagnosis not present

## 2021-11-03 DIAGNOSIS — E785 Hyperlipidemia, unspecified: Secondary | ICD-10-CM | POA: Diagnosis not present

## 2021-11-03 DIAGNOSIS — G44229 Chronic tension-type headache, not intractable: Secondary | ICD-10-CM | POA: Diagnosis not present

## 2021-11-03 DIAGNOSIS — Z0000 Encounter for general adult medical examination without abnormal findings: Secondary | ICD-10-CM | POA: Diagnosis not present

## 2021-11-04 ENCOUNTER — Other Ambulatory Visit (HOSPITAL_COMMUNITY): Payer: Self-pay

## 2021-11-04 MED ORDER — VENLAFAXINE HCL ER 75 MG PO CP24
75.0000 mg | ORAL_CAPSULE | Freq: Every day | ORAL | 3 refills | Status: DC
  Filled 2021-11-04: qty 90, 90d supply, fill #0
  Filled 2022-02-06: qty 90, 90d supply, fill #1
  Filled 2022-05-10: qty 90, 90d supply, fill #2
  Filled 2022-08-13: qty 90, 90d supply, fill #3

## 2022-03-21 DIAGNOSIS — H524 Presbyopia: Secondary | ICD-10-CM | POA: Diagnosis not present

## 2022-03-21 DIAGNOSIS — H52222 Regular astigmatism, left eye: Secondary | ICD-10-CM | POA: Diagnosis not present

## 2022-03-21 DIAGNOSIS — H5213 Myopia, bilateral: Secondary | ICD-10-CM | POA: Diagnosis not present

## 2022-03-31 DIAGNOSIS — Z803 Family history of malignant neoplasm of breast: Secondary | ICD-10-CM | POA: Diagnosis not present

## 2022-03-31 DIAGNOSIS — Z01419 Encounter for gynecological examination (general) (routine) without abnormal findings: Secondary | ICD-10-CM | POA: Diagnosis not present

## 2022-03-31 DIAGNOSIS — Z6825 Body mass index (BMI) 25.0-25.9, adult: Secondary | ICD-10-CM | POA: Diagnosis not present

## 2022-03-31 DIAGNOSIS — Z30431 Encounter for routine checking of intrauterine contraceptive device: Secondary | ICD-10-CM | POA: Diagnosis not present

## 2022-03-31 DIAGNOSIS — Z1231 Encounter for screening mammogram for malignant neoplasm of breast: Secondary | ICD-10-CM | POA: Diagnosis not present

## 2022-05-11 ENCOUNTER — Other Ambulatory Visit (HOSPITAL_COMMUNITY): Payer: Self-pay

## 2022-05-11 ENCOUNTER — Other Ambulatory Visit: Payer: Self-pay

## 2022-08-03 ENCOUNTER — Other Ambulatory Visit: Payer: Self-pay | Admitting: General Surgery

## 2022-08-03 DIAGNOSIS — Z803 Family history of malignant neoplasm of breast: Secondary | ICD-10-CM

## 2022-08-03 DIAGNOSIS — Z1239 Encounter for other screening for malignant neoplasm of breast: Secondary | ICD-10-CM

## 2022-09-14 ENCOUNTER — Encounter: Payer: Self-pay | Admitting: General Surgery

## 2022-09-15 ENCOUNTER — Ambulatory Visit: Admission: RE | Admit: 2022-09-15 | Payer: Commercial Managed Care - PPO | Source: Ambulatory Visit

## 2022-09-15 DIAGNOSIS — Z803 Family history of malignant neoplasm of breast: Secondary | ICD-10-CM | POA: Diagnosis not present

## 2022-09-15 DIAGNOSIS — Z1239 Encounter for other screening for malignant neoplasm of breast: Secondary | ICD-10-CM | POA: Diagnosis not present

## 2022-09-15 MED ORDER — GADOPICLENOL 0.5 MMOL/ML IV SOLN
6.0000 mL | Freq: Once | INTRAVENOUS | Status: AC | PRN
  Administered 2022-09-15: 6 mL via INTRAVENOUS

## 2022-10-25 ENCOUNTER — Other Ambulatory Visit (HOSPITAL_BASED_OUTPATIENT_CLINIC_OR_DEPARTMENT_OTHER): Payer: Self-pay

## 2022-10-25 MED ORDER — INFLUENZA VIRUS VACC SPLIT PF (FLUZONE) 0.5 ML IM SUSY
0.5000 mL | PREFILLED_SYRINGE | Freq: Once | INTRAMUSCULAR | 0 refills | Status: AC
  Filled 2022-10-25: qty 0.5, 1d supply, fill #0

## 2022-11-06 DIAGNOSIS — E785 Hyperlipidemia, unspecified: Secondary | ICD-10-CM | POA: Diagnosis not present

## 2022-11-06 DIAGNOSIS — Z0000 Encounter for general adult medical examination without abnormal findings: Secondary | ICD-10-CM | POA: Diagnosis not present

## 2022-11-09 ENCOUNTER — Other Ambulatory Visit (HOSPITAL_COMMUNITY): Payer: Self-pay

## 2022-11-09 DIAGNOSIS — E785 Hyperlipidemia, unspecified: Secondary | ICD-10-CM | POA: Diagnosis not present

## 2022-11-09 DIAGNOSIS — Z1211 Encounter for screening for malignant neoplasm of colon: Secondary | ICD-10-CM | POA: Diagnosis not present

## 2022-11-09 DIAGNOSIS — G44229 Chronic tension-type headache, not intractable: Secondary | ICD-10-CM | POA: Diagnosis not present

## 2022-11-09 DIAGNOSIS — Z0000 Encounter for general adult medical examination without abnormal findings: Secondary | ICD-10-CM | POA: Diagnosis not present

## 2022-11-09 MED ORDER — VENLAFAXINE HCL ER 75 MG PO CP24
75.0000 mg | ORAL_CAPSULE | Freq: Every day | ORAL | 3 refills | Status: AC
  Filled 2022-11-12: qty 90, 90d supply, fill #0
  Filled 2023-02-06: qty 90, 90d supply, fill #1
  Filled 2023-05-08: qty 90, 90d supply, fill #2
  Filled 2023-08-05: qty 90, 90d supply, fill #3

## 2022-11-12 ENCOUNTER — Other Ambulatory Visit (HOSPITAL_COMMUNITY): Payer: Self-pay

## 2022-11-13 ENCOUNTER — Other Ambulatory Visit (HOSPITAL_COMMUNITY): Payer: Self-pay

## 2022-11-14 ENCOUNTER — Other Ambulatory Visit (HOSPITAL_COMMUNITY): Payer: Self-pay

## 2022-12-08 ENCOUNTER — Encounter: Payer: Self-pay | Admitting: Gastroenterology

## 2023-01-26 ENCOUNTER — Other Ambulatory Visit (HOSPITAL_COMMUNITY): Payer: Self-pay

## 2023-01-26 ENCOUNTER — Other Ambulatory Visit: Payer: Self-pay

## 2023-01-26 ENCOUNTER — Ambulatory Visit (AMBULATORY_SURGERY_CENTER): Payer: Commercial Managed Care - PPO

## 2023-01-26 VITALS — Ht 63.0 in | Wt 145.0 lb

## 2023-01-26 DIAGNOSIS — Z1211 Encounter for screening for malignant neoplasm of colon: Secondary | ICD-10-CM

## 2023-01-26 MED ORDER — SUTAB 1479-225-188 MG PO TABS
12.0000 | ORAL_TABLET | ORAL | 0 refills | Status: DC
  Filled 2023-01-26: qty 24, 1d supply, fill #0

## 2023-01-26 NOTE — Progress Notes (Signed)
No egg or soy allergy known to patient  No issues known to pt with past sedation with any surgeries or procedures Patient denies ever being told they had issues or difficulty with intubation  No FH of Malignant Hyperthermia Pt is not on diet pills Pt is not on  home 02  Pt is not on blood thinners  Pt denies issues with constipation  No A fib or A flutter Have any cardiac testing pending--no  LOA: independent  Prep: sutab  Patient's chart reviewed by Cathlyn Parsons CNRA prior to previsit and patient appropriate for the [facility].  Previsit completed and red dot placed by patient's name on their procedure day (on provider's schedule).     PV competed with patient. Prep instructions sent via mychart and home address.

## 2023-01-29 ENCOUNTER — Other Ambulatory Visit (HOSPITAL_COMMUNITY): Payer: Self-pay

## 2023-02-09 ENCOUNTER — Other Ambulatory Visit (HOSPITAL_COMMUNITY): Payer: Self-pay

## 2023-02-14 ENCOUNTER — Encounter: Payer: Self-pay | Admitting: Gastroenterology

## 2023-02-16 ENCOUNTER — Ambulatory Visit (AMBULATORY_SURGERY_CENTER): Payer: Commercial Managed Care - PPO | Admitting: Gastroenterology

## 2023-02-16 ENCOUNTER — Encounter: Payer: Self-pay | Admitting: Gastroenterology

## 2023-02-16 VITALS — BP 113/75 | HR 64 | Temp 98.3°F | Resp 17 | Ht 63.0 in | Wt 145.0 lb

## 2023-02-16 DIAGNOSIS — K633 Ulcer of intestine: Secondary | ICD-10-CM

## 2023-02-16 DIAGNOSIS — Z1211 Encounter for screening for malignant neoplasm of colon: Secondary | ICD-10-CM

## 2023-02-16 DIAGNOSIS — D120 Benign neoplasm of cecum: Secondary | ICD-10-CM

## 2023-02-16 DIAGNOSIS — K5730 Diverticulosis of large intestine without perforation or abscess without bleeding: Secondary | ICD-10-CM

## 2023-02-16 DIAGNOSIS — K635 Polyp of colon: Secondary | ICD-10-CM | POA: Diagnosis not present

## 2023-02-16 DIAGNOSIS — K639 Disease of intestine, unspecified: Secondary | ICD-10-CM | POA: Diagnosis not present

## 2023-02-16 MED ORDER — SODIUM CHLORIDE 0.9 % IV SOLN: 500.0000 mL | INTRAVENOUS | Status: DC

## 2023-02-16 NOTE — Op Note (Signed)
 [facility] Patient Name: Caitlyn [name] Procedure Date: 02/16/2023 8:36 AM MRN: [id number] Endoscopist: Glendia E. Stacia , MD, [id number] Age: 48 Referring MD:  Date of Birth: [birth year] Gender: Female Account #: [id number] Procedure:                Colonoscopy Indications:              Screening for colorectal malignant neoplasm, This                            is the patient's first colonoscopy Medicines:                Monitored Anesthesia Care Procedure:                Pre-Anesthesia Assessment:                           - Prior to the procedure, a History and Physical                            was performed, and patient medications and                            allergies were reviewed. The patient's tolerance of                            previous anesthesia was also reviewed. The risks                            and benefits of the procedure and the sedation                            options and risks were discussed with the patient.                            All questions were answered, and informed consent                            was obtained. Prior Anticoagulants: The patient has                            taken no anticoagulant or antiplatelet agents. ASA                            Grade Assessment: II - A patient with mild systemic                            disease. After reviewing the risks and benefits,                            the patient was deemed in satisfactory condition to                            undergo the procedure.  After obtaining informed consent, the colonoscope                            was passed under direct vision. Throughout the                            procedure, the patient's blood pressure, pulse, and                            oxygen saturations were monitored continuously. The                            PCF-HQ190L Colonoscope [id number] was introduced                            through the anus and  advanced to the the terminal                            ileum, with identification of the appendiceal                            orifice and IC valve. The colonoscopy was performed                            without difficulty. The colonoscopy was somewhat                            difficult due to significant looping and a tortuous                            colon. Successful completion of the procedure was                            aided by using manual pressure and straightening                            and shortening the scope to obtain bowel loop                            reduction. The patient tolerated the procedure                            well. The quality of the bowel preparation was                            good. The terminal ileum, ileocecal valve,                            appendiceal orifice, and rectum were photographed. Scope In: 8:47:09 AM Scope Out: 9:14:14 AM Scope Withdrawal Time: 0 hours 16 minutes 19 seconds  Total Procedure Duration: 0 hours 27 minutes 5 seconds  Findings:                 The perianal and digital rectal examinations  were                            normal. Pertinent negatives include normal                            sphincter tone and no palpable rectal lesions.                           A single (solitary) five mm ulcer was found at the                            ileocecal valve. Biopsies were taken with a cold                            forceps for histology. Estimated blood loss was                            minimal.                           A 7 mm polyp was found in the cecum. The polyp was                            sessile. The polyp was removed with a cold snare.                            Resection and retrieval were complete. Estimated                            blood loss was minimal.                           A few small-mouthed diverticula were found in the                            sigmoid colon.                            The exam was otherwise normal throughout the                            examined colon.                           The terminal ileum appeared normal.                           The retroflexed view of the distal rectum and anal                            verge was normal and showed no anal or rectal                            abnormalities. Complications:  No immediate complications. Estimated Blood Loss:     Estimated blood loss was minimal. Impression:               - A single (solitary) ulcer at the ileocecal valve.                            Biopsied.                           - One 7 mm polyp in the cecum, removed with a cold                            snare. Resected and retrieved.                           - Mild diverticulosis in the sigmoid colon.                           - The examined portion of the ileum was normal.                           - The distal rectum and anal verge are normal on                            retroflexion view. Recommendation:           - Patient has a contact number available for                            emergencies. The signs and symptoms of potential                            delayed complications were discussed with the                            patient. Return to normal activities tomorrow.                            Written discharge instructions were provided to the                            patient.                           - Resume previous diet.                           - Continue present medications.                           - Await pathology results.                           - Repeat colonoscopy (date not yet determined) for  surveillance based on pathology results. Ziona Wickens E. Stacia, MD 02/16/2023 9:22:00 AM This report has been signed electronically.

## 2023-02-16 NOTE — Progress Notes (Signed)
 Called to room to assist during endoscopic procedure.  Patient ID and intended procedure confirmed with present staff. Received instructions for my participation in the procedure from the performing physician.

## 2023-02-16 NOTE — Patient Instructions (Addendum)
 Resume previous diet.                           - Continue present medications.                           - Await pathology results.                           - Repeat colonoscopy (date not yet determined) for                            surveillance based on pathology results. Handout on polyps and diverticulosis given.     YOU HAD AN ENDOSCOPIC PROCEDURE TODAY AT THE  ENDOSCOPY CENTER:   Refer to the procedure report that was given to you for any specific questions about what was found during the examination.  If the procedure report does not answer your questions, please call your gastroenterologist to clarify.  If you requested that your care partner not be given the details of your procedure findings, then the procedure report has been included in a sealed envelope for you to review at your convenience later.  YOU SHOULD EXPECT: Some feelings of bloating in the abdomen. Passage of more gas than usual.  Walking can help get rid of the air that was put into your GI tract during the procedure and reduce the bloating. If you had a lower endoscopy (such as a colonoscopy or flexible sigmoidoscopy) you may notice spotting of blood in your stool or on the toilet paper. If you underwent a bowel prep for your procedure, you may not have a normal bowel movement for a few days.  Please Note:  You might notice some irritation and congestion in your nose or some drainage.  This is from the oxygen used during your procedure.  There is no need for concern and it should clear up in a day or so.  SYMPTOMS TO REPORT IMMEDIATELY:  Following lower endoscopy (colonoscopy or flexible sigmoidoscopy):  Excessive amounts of blood in the stool  Significant tenderness or worsening of abdominal pains  Swelling of the abdomen that is new, acute  Fever of 100F or higher   For urgent or emergent issues, a gastroenterologist can be reached at any hour by calling [phone]. Do not use MyChart messaging for  urgent concerns.    DIET:  We do recommend a small meal at first, but then you may proceed to your regular diet.  Drink plenty of fluids but you should avoid alcoholic beverages for 24 hours.  ACTIVITY:  You should plan to take it easy for the rest of today and you should NOT DRIVE or use heavy machinery until tomorrow (because of the sedation medicines used during the test).    FOLLOW UP: Our staff will call the number listed on your records the next business day following your procedure.  We will call around 7:15- 8:00 am to check on you and address any questions or concerns that you may have regarding the information given to you following your procedure. If we do not reach you, we will leave a message.     If any biopsies were taken you will be contacted by phone or by letter within the next 1-3 weeks.  Please call us  at [phone] if you have not heard about  the biopsies in 3 weeks.    SIGNATURES/CONFIDENTIALITY: You and/or your care partner have signed paperwork which will be entered into your electronic medical record.  These signatures attest to the fact that that the information above on your After Visit Summary has been reviewed and is understood.  Full responsibility of the confidentiality of this discharge information lies with you and/or your care-partner.

## 2023-02-16 NOTE — Progress Notes (Signed)
  Gastroenterology History and Physical   Primary Care Physician:  Dyane Anthony RAMAN, FNP   Reason for Procedure:   Colon cancer screening  Plan:    Screening colonoscopy     HPI: Caitlyn [name] is a 48 y.o. female undergoing initial average risk screening colonoscopy.  She has no family history of colon cancer and no chronic GI symptoms.    Past Medical History:  Diagnosis Date   Headache     Past Surgical History:  Procedure Laterality Date   NO PAST SURGERIES      Prior to Admission medications   Medication Sig Start Date End Date Taking? Authorizing Provider  ascorbic acid (VITAMIN C) 500 MG tablet Take 500 mg by mouth daily.   Yes [provider]  cholecalciferol (VITAMIN D3) 25 MCG (1000 UNIT) tablet Take 1,000 Units by mouth daily.   Yes [provider]  levonorgestrel  (MIRENA , 52 MG,) 20 MCG/DAY IUD 1 each by Intrauterine route once. 02/20/20  Yes [provider]  Omega-3 Fatty Acids (FISH OIL) 500 MG CAPS Take 1 capsule by mouth daily.   Yes [provider]  venlafaxine  XR (EFFEXOR  XR) 75 MG 24 hr capsule Take 1 capsule (75 mg total) by mouth daily with food for headaches. 11/09/22  Yes   cetirizine (ZYRTEC ALLERGY) 10 MG tablet Take 10 mg by mouth as needed.    [provider]  Ferrous Sulfate (IRON PO) Take 25 mg by mouth daily. Vegan supplement    [provider]  Multiple Vitamin (MULTIVITAMIN) capsule Take 1 capsule by mouth daily.    [provider]  naproxen sodium (ALEVE) 220 MG tablet Take 220 mg by mouth as needed.    [provider]  triamcinolone cream (KENALOG) 0.1 %  03/21/16   [provider]  Turmeric 500 MG CAPS Take 1 capsule by mouth daily at 6 (six) AM.    [provider]    Current Outpatient Medications  Medication Sig Dispense Refill   ascorbic acid (VITAMIN C) 500 MG tablet Take 500 mg by mouth daily.     cholecalciferol (VITAMIN D3) 25 MCG (1000  UNIT) tablet Take 1,000 Units by mouth daily.     levonorgestrel  (MIRENA , 52 MG,) 20 MCG/DAY IUD 1 each by Intrauterine route once.     Omega-3 Fatty Acids (FISH OIL) 500 MG CAPS Take 1 capsule by mouth daily.     venlafaxine  XR (EFFEXOR  XR) 75 MG 24 hr capsule Take 1 capsule (75 mg total) by mouth daily with food for headaches. 90 capsule 3   cetirizine (ZYRTEC ALLERGY) 10 MG tablet Take 10 mg by mouth as needed.     Ferrous Sulfate (IRON PO) Take 25 mg by mouth daily. Vegan supplement     Multiple Vitamin (MULTIVITAMIN) capsule Take 1 capsule by mouth daily.     naproxen sodium (ALEVE) 220 MG tablet Take 220 mg by mouth as needed.     triamcinolone cream (KENALOG) 0.1 %   5   Turmeric 500 MG CAPS Take 1 capsule by mouth daily at 6 (six) AM.     Current Facility-Administered Medications  Medication Dose Route Frequency Provider Last Rate Last Admin   0.9 %  sodium chloride  infusion  500 mL Intravenous Continuous Caitlyn Glendia BRAVO, MD        Allergies as of 02/16/2023   (No Known Allergies)    Family History  Problem Relation Age of Onset   Cancer Mother    Cirrhosis  Father    Cancer Paternal Grandmother        ovarian    Heart failure Paternal Grandfather    Colon cancer Neg Hx    Colon polyps Neg Hx    Esophageal cancer Neg Hx    Rectal cancer Neg Hx    Stomach cancer Neg Hx     Social History   Socioeconomic History   Marital status: Married    Spouse name: Not on file   Number of children: Not on file   Years of education: Not on file   Highest education level: Not on file  Occupational History   Not on file  Tobacco Use   Smoking status: Former   Smokeless tobacco: Never  Substance and Sexual Activity   Alcohol use: Yes    Alcohol/week: 0.0 standard drinks of alcohol    Comment: social    Drug use: No   Sexual activity: Yes    Partners: Male  Other Topics Concern   Not on file  Social History Narrative   Not on file   Social Drivers of Health    Financial Resource Strain: Not on file  Food Insecurity: Not on file  Transportation Needs: Not on file  Physical Activity: Not on file  Stress: Not on file  Social Connections: Not on file  Intimate Partner Violence: Not on file    Review of Systems:  All other review of systems negative except as mentioned in the HPI.  Physical Exam: Vital signs BP 127/86   Pulse 90   Temp 98.3 F (36.8 C)   Resp 18   Ht 5' 3 (1.6 m)   Wt 145 lb (65.8 kg)   SpO2 100%   BMI 25.69 kg/m   General:   Alert,  Well-developed, well-nourished, pleasant and cooperative in NAD Airway:  Mallampati 2 Lungs:  Clear throughout to auscultation.   Heart:  Regular rate and rhythm; no murmurs, clicks, rubs,  or gallops. Abdomen:  Soft, nontender and nondistended. Normal bowel sounds.   Neuro/Psych:  Normal mood and affect. A and O x 3   Linc Renne E. Stacia, MD [facility]

## 2023-02-16 NOTE — Progress Notes (Signed)
 To pacu, VSS. Report to [email or web address]

## 2023-02-19 ENCOUNTER — Telehealth: Payer: Self-pay

## 2023-02-19 NOTE — Telephone Encounter (Signed)
  Follow up Call-     02/16/2023    8:11 AM  Call back number  Post procedure Call Back phone  # [phone]  Permission to leave phone message Yes     Patient questions:  Do you have a fever, pain , or abdominal swelling? No. Pain Score  0 *  Have you tolerated food without any problems? Yes.    Have you been able to return to your normal activities? Yes.    Do you have any questions about your discharge instructions: Diet   No. Medications  No. Follow up visit  No.  Do you have questions or concerns about your Care? No.  Actions: * If pain score is 4 or above: No action needed, pain <4.

## 2023-02-20 LAB — SURGICAL PATHOLOGY

## 2023-02-21 NOTE — Progress Notes (Signed)
 Caitlyn [name], The polyp which I removed during your recent procedure was proven to be completely benign but is considered a pre-cancerous polyp that MAY have grown into cancer if it had not been removed.  Studies shows that at least 20% of women over age 46 and 30% of men over age 57 have pre-cancerous polyps.  Based on current nationally recognized surveillance guidelines, I recommend that you have a repeat colonoscopy in 7 years.   The biopsies taken of the ulcer on your ileocecal valve showed reactive changes, without any features of Crohn's disease or dysplasia.  No further testing/evaluation or follow up is recommended for this finding.  If you develop any new rectal bleeding, abdominal pain or significant bowel habit changes, please contact me before then.

## 2023-03-23 DIAGNOSIS — H52222 Regular astigmatism, left eye: Secondary | ICD-10-CM | POA: Diagnosis not present

## 2023-03-23 DIAGNOSIS — H524 Presbyopia: Secondary | ICD-10-CM | POA: Diagnosis not present

## 2023-03-23 DIAGNOSIS — H5213 Myopia, bilateral: Secondary | ICD-10-CM | POA: Diagnosis not present

## 2023-04-06 DIAGNOSIS — Z9189 Other specified personal risk factors, not elsewhere classified: Secondary | ICD-10-CM | POA: Diagnosis not present

## 2023-04-06 DIAGNOSIS — Z30431 Encounter for routine checking of intrauterine contraceptive device: Secondary | ICD-10-CM | POA: Diagnosis not present

## 2023-04-06 DIAGNOSIS — Z01419 Encounter for gynecological examination (general) (routine) without abnormal findings: Secondary | ICD-10-CM | POA: Diagnosis not present

## 2023-04-06 DIAGNOSIS — Z6825 Body mass index (BMI) 25.0-25.9, adult: Secondary | ICD-10-CM | POA: Diagnosis not present

## 2023-04-09 ENCOUNTER — Other Ambulatory Visit: Payer: Self-pay | Admitting: General Surgery

## 2023-04-09 DIAGNOSIS — Z803 Family history of malignant neoplasm of breast: Secondary | ICD-10-CM

## 2023-05-11 ENCOUNTER — Ambulatory Visit
Admission: RE | Admit: 2023-05-11 | Discharge: 2023-05-11 | Disposition: A | Discharge status: Home / Self Care | Source: Ambulatory Visit | Attending: General Surgery | Admitting: General Surgery

## 2023-05-11 DIAGNOSIS — Z803 Family history of malignant neoplasm of breast: Secondary | ICD-10-CM

## 2023-05-11 MED ORDER — IOPAMIDOL (ISOVUE-370) INJECTION 76%
100.0000 mL | Freq: Once | INTRAVENOUS | Status: AC | PRN
  Administered 2023-05-11: 100 mL via INTRAVENOUS

## 2023-05-17 DIAGNOSIS — Z9189 Other specified personal risk factors, not elsewhere classified: Secondary | ICD-10-CM | POA: Diagnosis not present

## 2023-10-29 ENCOUNTER — Other Ambulatory Visit (HOSPITAL_BASED_OUTPATIENT_CLINIC_OR_DEPARTMENT_OTHER): Payer: Self-pay

## 2023-10-29 MED ORDER — FLUZONE 0.5 ML IM SUSY
0.5000 mL | PREFILLED_SYRINGE | Freq: Once | INTRAMUSCULAR | 0 refills | Status: AC
  Filled 2023-10-29: qty 0.5, 1d supply, fill #0

## 2023-11-07 DIAGNOSIS — Z0000 Encounter for general adult medical examination without abnormal findings: Secondary | ICD-10-CM | POA: Diagnosis not present

## 2023-11-07 DIAGNOSIS — E559 Vitamin D deficiency, unspecified: Secondary | ICD-10-CM | POA: Diagnosis not present

## 2023-11-07 DIAGNOSIS — E785 Hyperlipidemia, unspecified: Secondary | ICD-10-CM | POA: Diagnosis not present

## 2023-11-13 ENCOUNTER — Other Ambulatory Visit (HOSPITAL_COMMUNITY): Payer: Self-pay

## 2023-11-13 DIAGNOSIS — D649 Anemia, unspecified: Secondary | ICD-10-CM | POA: Diagnosis not present

## 2023-11-13 DIAGNOSIS — E785 Hyperlipidemia, unspecified: Secondary | ICD-10-CM | POA: Diagnosis not present

## 2023-11-13 DIAGNOSIS — G44229 Chronic tension-type headache, not intractable: Secondary | ICD-10-CM | POA: Diagnosis not present

## 2023-11-13 DIAGNOSIS — Z0000 Encounter for general adult medical examination without abnormal findings: Secondary | ICD-10-CM | POA: Diagnosis not present

## 2023-11-13 MED ORDER — VENLAFAXINE HCL ER 75 MG PO CP24
75.0000 mg | ORAL_CAPSULE | Freq: Every day | ORAL | 3 refills | Status: AC
  Filled 2023-11-13: qty 90, 90d supply, fill #0
  Filled 2024-02-03: qty 90, 90d supply, fill #1

## 2024-02-22 ENCOUNTER — Other Ambulatory Visit (HOSPITAL_COMMUNITY): Payer: Self-pay

## 2024-02-22 MED ORDER — TRIAMCINOLONE ACETONIDE 0.1 % EX OINT
TOPICAL_OINTMENT | CUTANEOUS | 1 refills | Status: AC
  Filled 2024-02-22: qty 60, 30d supply, fill #0
